# Patient Record
Sex: Female | Born: 1960 | Race: White | Hispanic: No | Smoking: Current every day smoker
Health system: Southern US, Community
[De-identification: ages and names within clinical notes are randomized; demographics above are authoritative.]

## PROBLEM LIST (undated history)

## (undated) ENCOUNTER — Emergency Department: Admission: EM | Discharge status: Absent without leave

## (undated) DIAGNOSIS — E559 Vitamin D deficiency, unspecified: Secondary | ICD-10-CM

## (undated) DIAGNOSIS — M2550 Pain in unspecified joint: Secondary | ICD-10-CM

## (undated) DIAGNOSIS — M159 Polyosteoarthritis, unspecified: Secondary | ICD-10-CM

## (undated) DIAGNOSIS — F329 Major depressive disorder, single episode, unspecified: Secondary | ICD-10-CM

## (undated) DIAGNOSIS — S92902A Unspecified fracture of left foot, initial encounter for closed fracture: Secondary | ICD-10-CM

## (undated) DIAGNOSIS — E785 Hyperlipidemia, unspecified: Secondary | ICD-10-CM

## (undated) DIAGNOSIS — IMO0001 Reserved for inherently not codable concepts without codable children: Secondary | ICD-10-CM

## (undated) DIAGNOSIS — Z1329 Encounter for screening for other suspected endocrine disorder: Secondary | ICD-10-CM

## (undated) DIAGNOSIS — Z13228 Encounter for screening for other metabolic disorders: Secondary | ICD-10-CM

## (undated) DIAGNOSIS — Z130 Encounter for screening for diseases of the blood and blood-forming organs and certain disorders involving the immune mechanism: Secondary | ICD-10-CM

## (undated) DIAGNOSIS — M1710 Unilateral primary osteoarthritis, unspecified knee: Secondary | ICD-10-CM

## (undated) DIAGNOSIS — L608 Other nail disorders: Secondary | ICD-10-CM

## (undated) DIAGNOSIS — M179 Osteoarthritis of knee, unspecified: Secondary | ICD-10-CM

## (undated) DIAGNOSIS — F419 Anxiety disorder, unspecified: Secondary | ICD-10-CM

## (undated) DIAGNOSIS — F3289 Other specified depressive episodes: Secondary | ICD-10-CM

## (undated) DIAGNOSIS — S83206A Unspecified tear of unspecified meniscus, current injury, right knee, initial encounter: Secondary | ICD-10-CM

## (undated) DIAGNOSIS — H04129 Dry eye syndrome of unspecified lacrimal gland: Secondary | ICD-10-CM

## (undated) DIAGNOSIS — Z0000 Encounter for general adult medical examination without abnormal findings: Secondary | ICD-10-CM

## (undated) DIAGNOSIS — R682 Dry mouth, unspecified: Secondary | ICD-10-CM

## (undated) DIAGNOSIS — R928 Other abnormal and inconclusive findings on diagnostic imaging of breast: Secondary | ICD-10-CM

## (undated) HISTORY — DX: Osteoarthritis of knee, unspecified: M17.9

## (undated) HISTORY — DX: Reserved for inherently not codable concepts without codable children: IMO0001

## (undated) HISTORY — DX: Pain in unspecified joint: M25.50

## (undated) HISTORY — DX: Major depressive disorder, single episode, unspecified: F32.9

## (undated) HISTORY — DX: Encounter for screening for diseases of the blood and blood-forming organs and certain disorders involving the immune mechanism: Z13.0

## (undated) HISTORY — DX: Encounter for screening for other suspected endocrine disorder: Z13.29

## (undated) HISTORY — DX: Dry eye syndrome of unspecified lacrimal gland: H04.129

## (undated) HISTORY — DX: Anxiety disorder, unspecified: F41.9

## (undated) HISTORY — DX: Vitamin D deficiency, unspecified: E55.9

## (undated) HISTORY — DX: Unilateral primary osteoarthritis, unspecified knee: M17.10

## (undated) HISTORY — DX: Other nail disorders: L60.8

## (undated) HISTORY — DX: Polyosteoarthritis, unspecified: M15.9

## (undated) HISTORY — DX: Encounter for general adult medical examination without abnormal findings: Z00.00

## (undated) HISTORY — DX: Other abnormal and inconclusive findings on diagnostic imaging of breast: R92.8

## (undated) HISTORY — DX: Hyperlipidemia, unspecified: E78.5

## (undated) HISTORY — DX: Dry mouth, unspecified: R68.2

## (undated) HISTORY — DX: Unspecified fracture of left foot, initial encounter for closed fracture: S92.902A

## (undated) HISTORY — DX: Other specified depressive episodes: F32.89

## (undated) HISTORY — DX: Encounter for screening for other metabolic disorders: Z13.228

## (undated) HISTORY — PX: FACIAL RECONSTRUCTION SURGERY: SHX631

## (undated) HISTORY — DX: Unspecified tear of unspecified meniscus, current injury, right knee, initial encounter: S83.206A

---

## 1990-10-25 HISTORY — PX: NOSE SURGERY: SHX723

## 1998-06-04 ENCOUNTER — Emergency Department (HOSPITAL_COMMUNITY): Admission: EM | Admit: 1998-06-04 | Discharge: 1998-06-04 | Payer: Self-pay | Admitting: Emergency Medicine

## 1998-10-25 HISTORY — PX: TOTAL ABDOMINAL HYSTERECTOMY W/ BILATERAL SALPINGOOPHORECTOMY: SHX83

## 2007-09-05 ENCOUNTER — Ambulatory Visit: Payer: Self-pay | Admitting: Family Medicine

## 2007-09-05 DIAGNOSIS — M159 Polyosteoarthritis, unspecified: Secondary | ICD-10-CM | POA: Insufficient documentation

## 2007-09-05 DIAGNOSIS — E785 Hyperlipidemia, unspecified: Secondary | ICD-10-CM | POA: Insufficient documentation

## 2007-09-05 DIAGNOSIS — M797 Fibromyalgia: Secondary | ICD-10-CM | POA: Insufficient documentation

## 2007-09-28 ENCOUNTER — Encounter: Payer: Self-pay | Admitting: Family Medicine

## 2007-09-28 LAB — CONVERTED CEMR LAB
ALT: 25 units/L (ref 0–35)
Alkaline Phosphatase: 69 units/L (ref 39–117)
BUN: 10 mg/dL (ref 6–23)
CO2: 25 meq/L (ref 19–32)
Calcium: 9.5 mg/dL (ref 8.4–10.5)
Chloride: 103 meq/L (ref 96–112)
Creatinine, Ser: 0.67 mg/dL (ref 0.40–1.20)
Total Bilirubin: 0.4 mg/dL (ref 0.3–1.2)
Total CHOL/HDL Ratio: 5.8
Triglycerides: 589 mg/dL — ABNORMAL HIGH (ref <150)

## 2007-09-29 ENCOUNTER — Encounter: Payer: Self-pay | Admitting: Family Medicine

## 2007-10-02 ENCOUNTER — Ambulatory Visit: Payer: Self-pay | Admitting: Family Medicine

## 2007-11-28 ENCOUNTER — Encounter: Payer: Self-pay | Admitting: Family Medicine

## 2007-12-04 ENCOUNTER — Ambulatory Visit: Payer: Self-pay | Admitting: Family Medicine

## 2007-12-04 DIAGNOSIS — H04129 Dry eye syndrome of unspecified lacrimal gland: Secondary | ICD-10-CM | POA: Insufficient documentation

## 2007-12-04 DIAGNOSIS — M2550 Pain in unspecified joint: Secondary | ICD-10-CM | POA: Insufficient documentation

## 2008-01-04 ENCOUNTER — Telehealth (INDEPENDENT_AMBULATORY_CARE_PROVIDER_SITE_OTHER): Payer: Self-pay | Admitting: *Deleted

## 2008-01-09 ENCOUNTER — Ambulatory Visit: Payer: Self-pay | Admitting: Family Medicine

## 2008-01-11 ENCOUNTER — Encounter: Payer: Self-pay | Admitting: Family Medicine

## 2008-01-11 LAB — CONVERTED CEMR LAB
ALT: 27 U/L
AST: 20 U/L
Anti Nuclear Antibody(ANA): NEGATIVE
Cholesterol: 299 mg/dL — ABNORMAL HIGH
HDL: 56 mg/dL
LDL Cholesterol: 171 mg/dL — ABNORMAL HIGH
Rheumatoid fact SerPl-aCnc: <20 [IU]/mL
Sed Rate: 8 mm/h
Total CHOL/HDL Ratio: 5.3
Triglycerides: 358 mg/dL — ABNORMAL HIGH
VLDL: 72 mg/dL — ABNORMAL HIGH

## 2008-01-12 ENCOUNTER — Telehealth: Payer: Self-pay | Admitting: Family Medicine

## 2008-02-08 ENCOUNTER — Encounter: Payer: Self-pay | Admitting: Family Medicine

## 2008-02-09 ENCOUNTER — Telehealth: Payer: Self-pay | Admitting: Family Medicine

## 2008-02-26 ENCOUNTER — Encounter: Payer: Self-pay | Admitting: Family Medicine

## 2008-08-06 ENCOUNTER — Telehealth: Payer: Self-pay | Admitting: Family Medicine

## 2008-08-08 ENCOUNTER — Telehealth: Payer: Self-pay | Admitting: Family Medicine

## 2008-12-11 ENCOUNTER — Ambulatory Visit: Payer: Self-pay | Admitting: Family Medicine

## 2008-12-11 DIAGNOSIS — F329 Major depressive disorder, single episode, unspecified: Secondary | ICD-10-CM

## 2008-12-11 DIAGNOSIS — F32A Depression, unspecified: Secondary | ICD-10-CM | POA: Insufficient documentation

## 2008-12-12 ENCOUNTER — Encounter: Payer: Self-pay | Admitting: Family Medicine

## 2008-12-12 LAB — CONVERTED CEMR LAB
ALT: 33 units/L (ref 0–35)
AST: 31 units/L (ref 0–37)
Alkaline Phosphatase: 68 units/L (ref 39–117)
BUN: 7 mg/dL (ref 6–23)
Glucose, Bld: 91 mg/dL (ref 70–99)
Potassium: 4.3 meq/L (ref 3.5–5.3)
Sodium: 143 meq/L (ref 135–145)
Triglycerides: 405 mg/dL — ABNORMAL HIGH (ref <150)

## 2008-12-13 ENCOUNTER — Telehealth: Payer: Self-pay | Admitting: Family Medicine

## 2008-12-13 ENCOUNTER — Encounter: Payer: Self-pay | Admitting: Family Medicine

## 2008-12-17 LAB — CONVERTED CEMR LAB: Direct LDL: 154 mg/dL — ABNORMAL HIGH

## 2009-04-07 ENCOUNTER — Telehealth: Payer: Self-pay | Admitting: Family Medicine

## 2009-05-08 ENCOUNTER — Telehealth: Payer: Self-pay | Admitting: Family Medicine

## 2009-07-14 ENCOUNTER — Encounter: Admission: RE | Admit: 2009-07-14 | Discharge: 2009-07-14 | Payer: Self-pay | Admitting: Family Medicine

## 2009-07-16 DIAGNOSIS — R928 Other abnormal and inconclusive findings on diagnostic imaging of breast: Secondary | ICD-10-CM | POA: Insufficient documentation

## 2009-08-13 ENCOUNTER — Encounter: Admission: RE | Admit: 2009-08-13 | Discharge: 2009-08-13 | Payer: Self-pay | Admitting: Family Medicine

## 2009-09-09 ENCOUNTER — Telehealth: Payer: Self-pay | Admitting: Family Medicine

## 2009-10-08 ENCOUNTER — Telehealth: Payer: Self-pay | Admitting: Family Medicine

## 2009-10-10 ENCOUNTER — Telehealth: Payer: Self-pay | Admitting: Family Medicine

## 2009-11-07 ENCOUNTER — Telehealth: Payer: Self-pay | Admitting: Family Medicine

## 2009-11-10 ENCOUNTER — Ambulatory Visit: Payer: Self-pay | Admitting: Family Medicine

## 2009-12-18 ENCOUNTER — Encounter: Payer: Self-pay | Admitting: Family Medicine

## 2009-12-19 LAB — CONVERTED CEMR LAB
ALT: 19 units/L (ref 0–35)
AST: 19 units/L (ref 0–37)
Alkaline Phosphatase: 59 units/L (ref 39–117)
Cholesterol: 219 mg/dL — ABNORMAL HIGH (ref 0–200)
Creatinine, Ser: 0.68 mg/dL (ref 0.40–1.20)
Glucose, Bld: 87 mg/dL (ref 70–99)
LDL Cholesterol: 84 mg/dL (ref 0–99)
Potassium: 4.1 meq/L (ref 3.5–5.3)
Total Bilirubin: 0.4 mg/dL (ref 0.3–1.2)
Total Protein: 7 g/dL (ref 6.0–8.3)
VLDL: 78 mg/dL — ABNORMAL HIGH (ref 0–40)

## 2010-04-13 ENCOUNTER — Ambulatory Visit: Payer: Self-pay | Admitting: Internal Medicine

## 2010-04-19 ENCOUNTER — Ambulatory Visit: Payer: Self-pay | Admitting: Internal Medicine

## 2010-05-05 ENCOUNTER — Telehealth: Payer: Self-pay | Admitting: Family Medicine

## 2010-05-05 DIAGNOSIS — E559 Vitamin D deficiency, unspecified: Secondary | ICD-10-CM | POA: Insufficient documentation

## 2010-05-25 ENCOUNTER — Ambulatory Visit: Payer: Self-pay | Admitting: Family Medicine

## 2010-05-25 DIAGNOSIS — L608 Other nail disorders: Secondary | ICD-10-CM | POA: Insufficient documentation

## 2010-05-26 LAB — CONVERTED CEMR LAB: Vit D, 25-Hydroxy: 63 ng/mL (ref 30–89)

## 2010-06-04 ENCOUNTER — Telehealth: Payer: Self-pay | Admitting: Family Medicine

## 2010-07-31 ENCOUNTER — Telehealth: Payer: Self-pay | Admitting: Family Medicine

## 2010-08-31 ENCOUNTER — Telehealth: Payer: Self-pay | Admitting: Family Medicine

## 2010-10-29 ENCOUNTER — Telehealth: Payer: Self-pay | Admitting: Family Medicine

## 2010-11-15 ENCOUNTER — Encounter: Payer: Self-pay | Admitting: Family Medicine

## 2010-11-24 NOTE — Progress Notes (Signed)
Summary: Refills  Phone Note Refill Request   Refills Requested: Medication #1:  VICOPROFEN 7.5-200 MG  TABS 1 tab by mouth q 8 hrs as needed severe pain  Medication #2:  KLONOPIN 0.5 MG  TABS 1 tab by mouth bid Initial call taken by: Payton Spark CMA,  June 04, 2010 1:48 PM    Prescriptions: KLONOPIN 0.5 MG  TABS (CLONAZEPAM) 1 tab by mouth bid  #60 x 0   Entered and Authorized by:   Seymour Bars DO   Signed by:   Seymour Bars DO on 06/05/2010   Method used:   Printed then faxed to ...       [facility] [phone][facility] (retail)       [address] [place], [place]  [address]       Ph: [phone] or [phone]       Fax: [phone]   RxID:   [id number] VICOPROFEN 7.5-200 MG  TABS (HYDROCODONE-IBUPROFEN) 1 tab by mouth q 8 hrs as needed severe pain  #60 x 0   Entered and Authorized by:   Seymour Bars DO   Signed by:   Seymour Bars DO on 06/05/2010   Method used:   Printed then faxed to ...       [facility]  [place] [phone][facility] (retail)       [address] [place], [place]  [address]       Ph: [phone] or [phone]       Fax: [phone]   RxID:   [id number]

## 2010-11-24 NOTE — Progress Notes (Signed)
Summary: Refills  Phone Note Refill Request   Refills Requested: Medication #1:  VICOPROFEN 7.5-200 MG  TABS 1 tab by mouth q 8 hrs as needed severe pain  Medication #2:  KLONOPIN 0.5 MG  TABS 1 tab by mouth bid Initial call taken by: Payton Spark CMA,  July 31, 2010 11:05 AM    Prescriptions: KLONOPIN 0.5 MG  TABS (CLONAZEPAM) 1 tab by mouth bid  #60 x 0   Entered and Authorized by:   Seymour Bars DO   Signed by:   Seymour Bars DO on 07/31/2010   Method used:   Printed then faxed to ...       [facility] [phone]* (retail)       [address] [place], [place]  [address]       Ph: [phone] or [phone]       Fax: [phone]   RxID:   [id number] VICOPROFEN 7.5-200 MG  TABS (HYDROCODONE-IBUPROFEN) 1 tab by mouth q 8 hrs as needed severe pain  #60 x 0   Entered and Authorized by:   Seymour Bars DO   Signed by:   Seymour Bars DO on 07/31/2010   Method used:   Printed then faxed to ...       [facility]  [place] [phone][facility] (retail)       [address] [place], [place]  [address]       Ph: [phone] or [phone]       Fax: [phone]   RxID:   [id number]
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
## 2010-11-24 NOTE — Progress Notes (Signed)
  Phone Note Refill Request   Refills Requested: Medication #1:  VICOPROFEN 7.5-200 MG  TABS 1 tab by mouth q 8 hrs as needed severe pain  Medication #2:  KLONOPIN 0.5 MG  TABS 1 tab by mouth bid Initial call taken by: Payton Spark CMA,  May 05, 2010 1:21 PM  Follow-up for Phone Call        due for OV in Aug. Will RF for July. Follow-up by: Seymour Bars DO,  May 05, 2010 1:27 PM    Prescriptions: KLONOPIN 0.5 MG  TABS (CLONAZEPAM) 1 tab by mouth bid  #60 x 0   Entered and Authorized by:   Seymour Bars DO   Signed by:   Seymour Bars DO on 05/05/2010   Method used:   Printed then faxed to ...       [facility]  [place] [phone][facility] ([facility])       [address] [place], [place]  [address]       Ph: [phone] or [phone]       Fax: [phone]   RxID:   [id number] VICOPROFEN 7.5-200 MG  TABS (HYDROCODONE-IBUPROFEN) 1 tab by mouth q 8 hrs as needed severe pain  #60 x 0   Entered and Authorized by:   Seymour Bars DO   Signed by:   Seymour Bars DO on 05/05/2010   Method used:   Printed then faxed to ...       [facility]  [place] [phone][facility] (retail)       [address] [place], [place]  [address]       Ph: [phone] or [phone]       Fax: [phone]   RxID:   [id number]

## 2010-11-24 NOTE — Progress Notes (Signed)
Summary: refill request  Phone Note Refill Request Message from:  Patient on August 31, 2010 9:04 AM  Refills Requested: Medication #1:  VICOPROFEN 7.5-200 MG  TABS 1 tab by mouth q 8 hrs as needed severe pain   Supply Requested: 1 month  Medication #2:  KLONOPIN 0.5 MG  TABS 1 tab by mouth bid   Supply Requested: 1 month [facility] [address]   Method Requested: Fax to Local Pharmacy Initial call taken by: Kathlene November LPN,  August 31, 2010 9:04 AM    Prescriptions: KLONOPIN 0.5 MG  TABS (CLONAZEPAM) 1 tab by mouth bid  #60 x 0   Entered and Authorized by:   Seymour Bars DO   Signed by:   Seymour Bars DO on 08/31/2010   Method used:   Printed then faxed to ...       [facility]  [place] [phone][facility] (retail)       [address] [place], [place]  [address]       Ph: [phone] or [phone]       Fax: [phone]   RxID:   [id number] VICOPROFEN 7.5-200 MG  TABS (HYDROCODONE-IBUPROFEN) 1 tab by mouth q 8 hrs as needed severe pain  #60 x 0   Entered and Authorized by:   Seymour Bars DO   Signed by:   Seymour Bars DO on 08/31/2010   Method used:   Printed then faxed to ...       [facility]  [place] [phone]* (retail)       [address] [place], [place]  [address]       Ph: [phone] or [phone]       Fax: [phone]   RxID:   [id number]
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
## 2010-11-24 NOTE — Progress Notes (Signed)
Summary: Klonopin and Vicoprofen refills  Phone Note Refill Request   Refills Requested: Medication #1:  KLONOPIN 0.5 MG  TABS 1 tab by mouth bid  Medication #2:  VICOPROFEN 7.5-200 MG  TABS 1 tab by mouth q 8 hrs as needed severe pain Initial call taken by: Payton Spark CMA,  November 07, 2009 3:48 PM    Denied.  Pt overdue for OV.  Seymour Bars, D.O.  Appended Document: Klonopin and Vicoprofen refills LMOM informing Pt of the above.

## 2010-11-26 NOTE — Progress Notes (Signed)
Summary: ? OV  Phone Note Call from Patient   Caller: Patient Summary of Call: Pt LMOM stating she thinks she is due for OV in Feb and wanted to know if she needed CPE or F/U. She also wanted to know if she needed fasting labs w/ this visit. Please advise. Initial call taken by: Payton Spark CMA,  October 29, 2010 11:55 AM  Follow-up for Phone Call        schedule CPE with fasting labs after 2-24.   Follow-up by: Seymour Bars DO,  October 29, 2010 1:42 PM     Appended Document: ? OV Pt aware of the above

## 2010-12-18 ENCOUNTER — Telehealth: Payer: Self-pay | Admitting: Family Medicine

## 2010-12-22 ENCOUNTER — Encounter: Payer: Self-pay | Admitting: Family Medicine

## 2010-12-22 ENCOUNTER — Ambulatory Visit (INDEPENDENT_AMBULATORY_CARE_PROVIDER_SITE_OTHER): Payer: BC Managed Care – PPO | Admitting: Family Medicine

## 2010-12-22 DIAGNOSIS — H04129 Dry eye syndrome of unspecified lacrimal gland: Secondary | ICD-10-CM

## 2010-12-22 DIAGNOSIS — F329 Major depressive disorder, single episode, unspecified: Secondary | ICD-10-CM

## 2010-12-22 DIAGNOSIS — F3289 Other specified depressive episodes: Secondary | ICD-10-CM

## 2010-12-22 DIAGNOSIS — IMO0001 Reserved for inherently not codable concepts without codable children: Secondary | ICD-10-CM

## 2010-12-22 NOTE — Progress Notes (Signed)
Summary: Appt 2/28--?labs prior  Phone Note Call from Patient Call back at Home Phone [phone]   Summary of Call: Patient left message on vm yesterday (2:59pm) that she has an upcoming appt on 12/22/10. She would like to know if labs are needed prior. Please advise. Initial call taken by: Lucious Groves CMA,  December 18, 2010 9:09 AM  Follow-up for Phone Call        order printed. Follow-up by: Seymour Bars DO,  December 18, 2010 9:19 AM  New Problems: OTH&UNSPEC ENDOCRN NUTRIT METAB&IMMUNITY D/O (ICD-V77.99)   New Problems: OTH&UNSPEC ENDOCRN NUTRIT METAB&IMMUNITY D/O (ICD-V77.99)  Appended Document: Appt 2/28--?labs prior    Phone Note Outgoing Call   Summary of Call: MD printed order for the pt. Left message on machine to call back to office. Lucious Groves CMA  December 18, 2010 9:33 AM   Patient notified and aware to be fasting. Lucious Groves CMA  December 18, 2010 10:50 AM

## 2010-12-23 LAB — CONVERTED CEMR LAB
ALT: 23 units/L (ref 0–35)
Alkaline Phosphatase: 71 units/L (ref 39–117)
BUN: 8 mg/dL (ref 6–23)
Calcium: 9.3 mg/dL (ref 8.4–10.5)
Creatinine, Ser: 0.72 mg/dL (ref 0.40–1.20)
Potassium: 3.9 meq/L (ref 3.5–5.3)
Total Bilirubin: 0.4 mg/dL (ref 0.3–1.2)
Total Protein: 6.8 g/dL (ref 6.0–8.3)
Triglycerides: 350 mg/dL — ABNORMAL HIGH (ref <150)
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
## 2011-01-12 NOTE — Letter (Signed)
Summary: Patient Health Questionnaire  Patient Health Questionnaire   Imported By: Maryln Gottron 01/05/2011 11:27:47  _____________________________________________________________________  External Attachment:    Type:   Image     Comment:   External Document

## 2011-01-26 ENCOUNTER — Other Ambulatory Visit: Payer: Self-pay | Admitting: *Deleted

## 2011-01-26 MED ORDER — HYDROCODONE-IBUPROFEN 7.5-200 MG PO TABS: 1.0000 | ORAL_TABLET | Freq: Three times a day (TID) | ORAL | Status: DC | PRN

## 2011-01-26 MED ORDER — CLONAZEPAM 0.5 MG PO TABS: 0.5000 mg | ORAL_TABLET | Freq: Two times a day (BID) | ORAL | Status: DC | PRN

## 2011-02-05 ENCOUNTER — Emergency Department: Payer: Self-pay | Admitting: Emergency Medicine

## 2011-02-06 ENCOUNTER — Encounter: Payer: Self-pay | Admitting: Family Medicine

## 2011-02-06 ENCOUNTER — Ambulatory Visit (INDEPENDENT_AMBULATORY_CARE_PROVIDER_SITE_OTHER): Payer: BC Managed Care – PPO | Admitting: Family Medicine

## 2011-02-06 VITALS — BP 156/100 | HR 92 | Temp 99.0°F | Ht 67.0 in | Wt 160.0 lb

## 2011-02-06 DIAGNOSIS — K047 Periapical abscess without sinus: Secondary | ICD-10-CM
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
## 2011-02-06 NOTE — Progress Notes (Signed)
  Subjective:    Patient ID: Lori [name], female    DOB: Jul 13, 1961, 50 y.o.   MRN: [id number]  HPI CC: mouth pain  3d h/o swelling in mouth, R side.  Called dentist on Friday and this am, no response.  Went to [facility] ER (yesterday), given amoxicillin 500mg  tid as well as oxycodone 5mg  #10.  Has doubled up on amoxicillin, took 2500mg  yesterday and 1000mg  so far today.  Finished with oxycodone overnight.  Yesterday pain 9/10, today 8/10.  Tmax 99.  bp elevated today.  Pt endorses hoarseness and trouble swallowing.  No trouble opening/closing mouth but is tender to talk.  Under pain contract with Dr. Cathey Endow, takes hydrocodone bid and ibuprofen in between.  Has been on narcotic therapy for 3 years, states pain meds don't phase her.  H/o fibromyalgia, arthritis per patient.  ? Sjogrens.  At ER told needed to have dentist drain abscess.  Acetaminophen making bloated and weight gain so stopped this.  Review of Systems Per HPI    Objective:   Physical Exam  Constitutional: She appears well-developed and well-nourished. She appears distressed (uncomfortable from mouth lesion).  HENT:  Head: Normocephalic and atraumatic. No trismus in the jaw.  Right Ear: External ear normal.  Left Ear: External ear normal.  Nose: Nose normal.  Mouth/Throat: Oropharynx is clear and moist and mucous membranes are normal. Oral lesions present. Abnormal dentition. Dental abscesses present. No uvula swelling.         + tender to palpation R maxilla and slight swelling maxillary region.  Eyes: Conjunctivae and EOM are normal. Pupils are equal, round, and reactive to light.  Neck: Normal range of motion. Neck supple. No thyromegaly present.  Cardiovascular: Normal rate, regular rhythm, normal heart sounds and intact distal pulses.   No murmur heard. Lymphadenopathy:       Head (right side): No submental, no submandibular, no tonsillar, no preauricular, no posterior auricular and no occipital adenopathy present.      Head (left side): No submental, no submandibular, no tonsillar, no preauricular, no posterior auricular and no occipital adenopathy present.    She has no cervical adenopathy.          Assessment & Plan:

## 2011-02-06 NOTE — Assessment & Plan Note (Addendum)
R upper hard palate/premolar abscess.  No systemic sxs currently. On abx x 2 days, took 20 advil 200mg  throughout course of day yesterday, advised against doing this, advised to push water.  Stop all NSAIDs for now. Finished 10 oxycodone 5mg  given at ER overnight.  Advised to start tylenol and I refilled oxycodone 10mg  x #10 to treat acute abscess as I will recommend stop NSAIDs for now (overdosed yesterday). I called Dr. Lucky Cowboy, on call dentist, to discuss pt - he agrees to see her in office this afternoon to eval, possible I&D of abscess.  I think it will need drainage. Continue amoxicillin 500mg  tid for now. Rec f/u with PCP this week for pain management.

## 2011-02-09 ENCOUNTER — Telehealth: Payer: Self-pay | Admitting: Family Medicine

## 2011-02-09 NOTE — Telephone Encounter (Signed)
Pls let pt know that I received her call a nurse note and wanted to see how she is feeling.  It was not clear if she is having sinus symptoms or a dental problem from the note.

## 2011-02-10 NOTE — Telephone Encounter (Signed)
LMOM for Pt to CB 

## 2011-02-25 ENCOUNTER — Other Ambulatory Visit: Payer: Self-pay | Admitting: *Deleted

## 2011-02-25 MED ORDER — HYDROCODONE-IBUPROFEN 7.5-200 MG PO TABS: 1.0000 | ORAL_TABLET | Freq: Three times a day (TID) | ORAL | Status: DC | PRN

## 2011-02-25 MED ORDER — CLONAZEPAM 0.5 MG PO TABS: 0.5000 mg | ORAL_TABLET | Freq: Two times a day (BID) | ORAL | Status: DC | PRN

## 2011-02-26 ENCOUNTER — Telehealth: Payer: Self-pay | Admitting: Family Medicine

## 2011-02-26 MED ORDER — CLONAZEPAM 0.5 MG PO TABS: 0.5000 mg | ORAL_TABLET | Freq: Two times a day (BID) | ORAL | Status: DC | PRN

## 2011-02-26 MED ORDER — HYDROCODONE-IBUPROFEN 7.5-200 MG PO TABS: 1.0000 | ORAL_TABLET | Freq: Three times a day (TID) | ORAL | Status: DC | PRN

## 2011-02-26 NOTE — Telephone Encounter (Signed)
RX's RFd today.

## 2011-02-26 NOTE — Telephone Encounter (Signed)
Pt called to let Payton Spark know that the pharmacy found her script.  Just FYI Jarvis Newcomer, LPN Domingo Dimes

## 2011-03-26 ENCOUNTER — Other Ambulatory Visit: Payer: Self-pay | Admitting: *Deleted

## 2011-03-26 MED ORDER — HYDROCODONE-IBUPROFEN 7.5-200 MG PO TABS: 1.0000 | ORAL_TABLET | Freq: Three times a day (TID) | ORAL | Status: DC | PRN

## 2011-03-26 MED ORDER — CLONAZEPAM 0.5 MG PO TABS: 0.5000 mg | ORAL_TABLET | Freq: Two times a day (BID) | ORAL | Status: DC | PRN

## 2011-04-23 ENCOUNTER — Telehealth: Payer: Self-pay | Admitting: Family Medicine

## 2011-04-23 MED ORDER — CLONAZEPAM 0.5 MG PO TABS: 0.5000 mg | ORAL_TABLET | Freq: Two times a day (BID) | ORAL | Status: DC | PRN

## 2011-04-23 MED ORDER — HYDROCODONE-IBUPROFEN 7.5-200 MG PO TABS: 1.0000 | ORAL_TABLET | Freq: Three times a day (TID) | ORAL | Status: DC | PRN

## 2011-05-25 NOTE — Telephone Encounter (Signed)
Error

## 2011-05-26 ENCOUNTER — Other Ambulatory Visit: Payer: Self-pay | Admitting: Family Medicine

## 2011-05-26 MED ORDER — HYDROCODONE-IBUPROFEN 7.5-200 MG PO TABS: 1.0000 | ORAL_TABLET | Freq: Three times a day (TID) | ORAL | Status: DC | PRN

## 2011-05-26 MED ORDER — CLONAZEPAM 0.5 MG PO TABS: 0.5000 mg | ORAL_TABLET | Freq: Two times a day (BID) | ORAL | Status: DC | PRN

## 2011-05-26 NOTE — Telephone Encounter (Signed)
Pt calling for refills of her clonazepam and hydrocodone medications.  Not asking for meds too early and on pain contract with Dr. Cathey Endow. Plan:  Called a verbal on both these medications since the vicoprofen for whatever reason would not print off.  However, they are showing in the system. Jarvis Newcomer, LPN Domingo Dimes

## 2011-05-27 ENCOUNTER — Telehealth: Payer: Self-pay | Admitting: Family Medicine

## 2011-06-21 NOTE — Telephone Encounter (Signed)
Closed

## 2011-06-22 ENCOUNTER — Ambulatory Visit (INDEPENDENT_AMBULATORY_CARE_PROVIDER_SITE_OTHER): Payer: BC Managed Care – PPO | Admitting: Family Medicine

## 2011-06-22 ENCOUNTER — Encounter: Payer: Self-pay | Admitting: Family Medicine

## 2011-06-22 VITALS — BP 120/78 | HR 95 | Ht 66.0 in | Wt 153.0 lb

## 2011-06-22 DIAGNOSIS — F329 Major depressive disorder, single episode, unspecified: Secondary | ICD-10-CM

## 2011-06-22 DIAGNOSIS — M159 Polyosteoarthritis, unspecified: Secondary | ICD-10-CM

## 2011-06-22 DIAGNOSIS — F3289 Other specified depressive episodes: Secondary | ICD-10-CM

## 2011-06-22 MED ORDER — PITAVASTATIN CALCIUM 2 MG PO TABS: 2.0000 mg | ORAL_TABLET | Freq: Every day | ORAL | Status: DC

## 2011-06-22 NOTE — Assessment & Plan Note (Addendum)
PHQ-9 score of 1 today. She is doing well. Continue current regimen. Followup in 6 months.

## 2011-06-22 NOTE — Assessment & Plan Note (Signed)
Continue current regimen. Followup in 6 months. I also discussed with her the importance of using her medication only as needed for pain control. We reviewed that she can develop a tolerance to the medication and to avoid this if she should not use the medication on a daily basis.

## 2011-06-22 NOTE — Progress Notes (Signed)
  Subjective:    Patient ID: Lori [name], female    DOB: 1961/06/10, 50 y.o.   MRN: [id number]  HPI  Depression. On zoloft and klonopin for several years.  She is sleeping well. She doesn't want to wean off her meds. She is happy with her mood right now. She also has OCD as feels it is well controlled.   Hx of OA, knee pain, and fibromyalgia. Was placed on hydrocodone twice day. She said she was taken off of tramadol because of the potential interaction with her Zoloft. She also says that she felt most high on the tramadol. She feels this helps her stay moving and active. Says occ skips her med when she is feeling better.   Review of Systems     Objective:   Physical Exam  Constitutional: She is oriented to person, place, and time. She appears well-developed and well-nourished.  HENT:  Head: Normocephalic and atraumatic.  Cardiovascular: Normal rate, regular rhythm and normal heart sounds.   Pulmonary/Chest: Effort normal and breath sounds normal.  Neurological: She is alert and oriented to person, place, and time. She displays normal reflexes.  Skin: Skin is warm and dry.  Psychiatric: She has a normal mood and affect. Her behavior is normal.          Assessment & Plan:

## 2011-06-24 ENCOUNTER — Other Ambulatory Visit: Payer: Self-pay | Admitting: *Deleted

## 2011-06-24 MED ORDER — CLONAZEPAM 0.5 MG PO TABS: 0.5000 mg | ORAL_TABLET | Freq: Two times a day (BID) | ORAL | Status: DC | PRN

## 2011-06-24 MED ORDER — HYDROCODONE-IBUPROFEN 7.5-200 MG PO TABS: 1.0000 | ORAL_TABLET | Freq: Three times a day (TID) | ORAL | Status: DC | PRN

## 2011-07-09 ENCOUNTER — Other Ambulatory Visit: Payer: Self-pay | Admitting: Family Medicine

## 2011-07-09 NOTE — Telephone Encounter (Signed)
Closed

## 2011-07-12 ENCOUNTER — Telehealth: Payer: Self-pay | Admitting: Family Medicine

## 2011-07-12 NOTE — Telephone Encounter (Signed)
Received fax from [facility] needing prior auth on pt med for livalo 2 mg tablets. Plan:  [employer] and spoke with Felicia  For prior auth of livalo 2 mg.  Will send form for completion, and will need to fax back.  Filled out as much as could, and will give to Fr. Metheney to review and sign, and then will fax back to [facility]. Jarvis Newcomer, LPN Domingo Dimes

## 2011-07-22 ENCOUNTER — Other Ambulatory Visit: Payer: Self-pay | Admitting: Family Medicine

## 2011-07-22 MED ORDER — HYDROCODONE-IBUPROFEN 7.5-200 MG PO TABS: 1.0000 | ORAL_TABLET | Freq: Three times a day (TID) | ORAL | Status: DC | PRN

## 2011-07-22 MED ORDER — CLONAZEPAM 0.5 MG PO TABS: 0.5000 mg | ORAL_TABLET | Freq: Two times a day (BID) | ORAL | Status: DC | PRN

## 2011-08-19 ENCOUNTER — Other Ambulatory Visit: Payer: Self-pay | Admitting: *Deleted

## 2011-08-19 MED ORDER — HYDROCODONE-IBUPROFEN 7.5-200 MG PO TABS: 1.0000 | ORAL_TABLET | Freq: Three times a day (TID) | ORAL | Status: DC | PRN

## 2011-08-19 MED ORDER — CLONAZEPAM 0.5 MG PO TABS: 0.5000 mg | ORAL_TABLET | Freq: Two times a day (BID) | ORAL | Status: DC | PRN

## 2011-09-13 ENCOUNTER — Other Ambulatory Visit: Payer: Self-pay | Admitting: *Deleted

## 2011-09-13 ENCOUNTER — Other Ambulatory Visit: Payer: Self-pay | Admitting: Family Medicine

## 2011-09-13 MED ORDER — CLONAZEPAM 0.5 MG PO TABS: 0.5000 mg | ORAL_TABLET | Freq: Two times a day (BID) | ORAL | Status: DC | PRN

## 2011-09-13 MED ORDER — HYDROCODONE-IBUPROFEN 7.5-200 MG PO TABS: 1.0000 | ORAL_TABLET | Freq: Three times a day (TID) | ORAL | Status: DC | PRN

## 2011-10-13 ENCOUNTER — Other Ambulatory Visit: Payer: Self-pay | Admitting: *Deleted

## 2011-10-13 MED ORDER — CLONAZEPAM 0.5 MG PO TABS: 0.5000 mg | ORAL_TABLET | Freq: Two times a day (BID) | ORAL | Status: DC | PRN

## 2011-10-13 MED ORDER — HYDROCODONE-IBUPROFEN 7.5-200 MG PO TABS: 1.0000 | ORAL_TABLET | Freq: Three times a day (TID) | ORAL | Status: DC | PRN

## 2011-11-11 ENCOUNTER — Other Ambulatory Visit: Payer: Self-pay | Admitting: *Deleted

## 2011-11-11 ENCOUNTER — Other Ambulatory Visit: Payer: Self-pay | Admitting: Family Medicine

## 2011-11-11 MED ORDER — CLONAZEPAM 0.5 MG PO TABS: 0.5000 mg | ORAL_TABLET | Freq: Two times a day (BID) | ORAL | Status: DC | PRN

## 2011-11-11 MED ORDER — HYDROCODONE-IBUPROFEN 7.5-200 MG PO TABS: 1.0000 | ORAL_TABLET | Freq: Three times a day (TID) | ORAL | Status: DC | PRN

## 2011-12-10 ENCOUNTER — Other Ambulatory Visit: Payer: Self-pay | Admitting: Family Medicine

## 2011-12-10 ENCOUNTER — Other Ambulatory Visit: Payer: Self-pay | Admitting: *Deleted

## 2011-12-10 MED ORDER — HYDROCODONE-IBUPROFEN 7.5-200 MG PO TABS: 1.0000 | ORAL_TABLET | Freq: Three times a day (TID) | ORAL | Status: DC | PRN

## 2011-12-10 MED ORDER — CLONAZEPAM 0.5 MG PO TABS: 0.5000 mg | ORAL_TABLET | Freq: Two times a day (BID) | ORAL | Status: DC | PRN

## 2011-12-10 MED ORDER — SERTRALINE HCL 100 MG PO TABS: 100.0000 mg | ORAL_TABLET | Freq: Every day | ORAL | Status: DC

## 2011-12-17 ENCOUNTER — Encounter: Payer: Self-pay | Admitting: *Deleted

## 2011-12-22 ENCOUNTER — Telehealth: Payer: Self-pay | Admitting: *Deleted

## 2011-12-22 DIAGNOSIS — E785 Hyperlipidemia, unspecified: Secondary | ICD-10-CM

## 2011-12-22 DIAGNOSIS — Z1329 Encounter for screening for other suspected endocrine disorder: Secondary | ICD-10-CM

## 2011-12-22 DIAGNOSIS — Z130 Encounter for screening for diseases of the blood and blood-forming organs and certain disorders involving the immune mechanism: Secondary | ICD-10-CM

## 2011-12-22 NOTE — Telephone Encounter (Signed)
Pt called for lab order 

## 2011-12-23 ENCOUNTER — Ambulatory Visit (INDEPENDENT_AMBULATORY_CARE_PROVIDER_SITE_OTHER): Payer: BC Managed Care – PPO | Admitting: Family Medicine

## 2011-12-23 ENCOUNTER — Encounter: Payer: Self-pay | Admitting: Family Medicine

## 2011-12-23 DIAGNOSIS — F429 Obsessive-compulsive disorder, unspecified: Secondary | ICD-10-CM

## 2011-12-23 DIAGNOSIS — F329 Major depressive disorder, single episode, unspecified: Secondary | ICD-10-CM

## 2011-12-23 DIAGNOSIS — E785 Hyperlipidemia, unspecified: Secondary | ICD-10-CM

## 2011-12-23 DIAGNOSIS — Z1211 Encounter for screening for malignant neoplasm of colon: Secondary | ICD-10-CM

## 2011-12-23 DIAGNOSIS — G8929 Other chronic pain: Secondary | ICD-10-CM

## 2011-12-23 DIAGNOSIS — F3289 Other specified depressive episodes: Secondary | ICD-10-CM

## 2011-12-23 DIAGNOSIS — F32A Depression, unspecified: Secondary | ICD-10-CM

## 2011-12-23 LAB — LIPID PANEL
Cholesterol: 248 mg/dL — ABNORMAL HIGH (ref 0–200)
HDL: 63 mg/dL (ref >39)
LDL Cholesterol: 126 mg/dL — ABNORMAL HIGH (ref 0–99)
Triglycerides: 293 mg/dL — ABNORMAL HIGH (ref <150)
VLDL: 59 mg/dL — ABNORMAL HIGH (ref 0–40)

## 2011-12-23 LAB — COMPLETE METABOLIC PANEL WITH GFR
ALT: 41 U/L — ABNORMAL HIGH (ref 0–35)
CO2: 26 mEq/L (ref 19–32)
Calcium: 10 mg/dL (ref 8.4–10.5)
Chloride: 103 mEq/L (ref 96–112)
Creat: 0.75 mg/dL (ref 0.50–1.10)
GFR, Est African American: >89 mL/min

## 2011-12-23 NOTE — Progress Notes (Signed)
  Subjective:    Patient ID: Lori [name], female    DOB: May 09, 1961, 51 y.o.   MRN: [id number]  HPI Depression - doing well overall. No decreased mood or major sleep problems.  Hyperlipidemia - she is taking hre livalo about 5 days per week. Also has worked on reducing her diary and fat intake.   Chronic Pain and fibromyalgia - She says she really tries to stay active. Doesn't take the vicoprofen every day.Does take Tyelnol every day.    Review of Systems     Objective:   Physical Exam  Constitutional: She is oriented to person, place, and time. She appears well-developed and well-nourished.  HENT:  Head: Normocephalic and atraumatic.  Cardiovascular: Normal rate, regular rhythm and normal heart sounds.   Pulmonary/Chest: Effort normal and breath sounds normal.  Neurological: She is alert and oriented to person, place, and time.  Skin: Skin is warm and dry.  Psychiatric: She has a normal mood and affect. Her behavior is normal.          Assessment & Plan:  Depresssion - PHQ- 9 score of 0. Continue sertraline. Also for her OCD and her fibromyalgia.   Hyperlipidemia  - Awaiting lipids. She went for blood work this morning. Will adjust if needed. Only taking 5 nights per week  Chornic Pain/Fibromyalgia - Has a narcotic contract. Dong well. I feel she is using her medications appropriately at this point in time. Followup in 6 months. She is doing on the sertraline to continue this.  Discussed need for colonoscopy. Pt ok with referrra.

## 2011-12-24 ENCOUNTER — Telehealth: Payer: Self-pay | Admitting: *Deleted

## 2011-12-24 NOTE — Telephone Encounter (Signed)
Pt notiified of MD instructions. KJ LPN

## 2011-12-24 NOTE — Telephone Encounter (Signed)
Pt calls back and states she takes 3000mg  of Tylenol daily as well as Ibuprofen 600mg  and Vicoprofen for her pain. States that if she can not take the Tylenol then she will not be able to function. Wants to know what she can do

## 2011-12-24 NOTE — Telephone Encounter (Signed)
Let just decrease the tylenol to 2000mg  a day for 1 week and then recheck the enzymes.

## 2012-01-05 ENCOUNTER — Other Ambulatory Visit: Payer: Self-pay | Admitting: *Deleted

## 2012-01-05 ENCOUNTER — Telehealth: Payer: Self-pay | Admitting: *Deleted

## 2012-01-05 DIAGNOSIS — R748 Abnormal levels of other serum enzymes: Secondary | ICD-10-CM

## 2012-01-05 MED ORDER — CLONAZEPAM 0.5 MG PO TABS: 0.5000 mg | ORAL_TABLET | Freq: Two times a day (BID) | ORAL | Status: DC | PRN

## 2012-01-05 MED ORDER — HYDROCODONE-IBUPROFEN 7.5-200 MG PO TABS: 1.0000 | ORAL_TABLET | Freq: Three times a day (TID) | ORAL | Status: DC | PRN

## 2012-01-05 NOTE — Telephone Encounter (Signed)
Pt calling for lab oder to be sent to the lab

## 2012-01-06 ENCOUNTER — Other Ambulatory Visit: Payer: Self-pay | Admitting: Family Medicine

## 2012-01-06 DIAGNOSIS — R748 Abnormal levels of other serum enzymes: Secondary | ICD-10-CM

## 2012-01-06 LAB — COMPLETE METABOLIC PANEL WITH GFR
AST: 43 U/L — ABNORMAL HIGH (ref 0–37)
Albumin: 4.6 g/dL (ref 3.5–5.2)
BUN: 7 mg/dL (ref 6–23)
Calcium: 10.1 mg/dL (ref 8.4–10.5)
Chloride: 104 mEq/L (ref 96–112)
Creat: 0.69 mg/dL (ref 0.50–1.10)
GFR, Est Non African American: >89 mL/min
Glucose, Bld: 75 mg/dL (ref 70–99)
Potassium: 4.4 mEq/L (ref 3.5–5.3)

## 2012-01-10 ENCOUNTER — Ambulatory Visit
Admission: RE | Admit: 2012-01-10 | Discharge: 2012-01-10 | Disposition: A | Discharge status: Home / Self Care | Payer: BC Managed Care – PPO | Source: Ambulatory Visit | Attending: Family Medicine | Admitting: Family Medicine

## 2012-01-10 DIAGNOSIS — R748 Abnormal levels of other serum enzymes: Secondary | ICD-10-CM

## 2012-01-14 ENCOUNTER — Other Ambulatory Visit: Payer: Self-pay | Admitting: Family Medicine

## 2012-01-14 ENCOUNTER — Telehealth: Payer: Self-pay | Admitting: *Deleted

## 2012-01-14 DIAGNOSIS — R0602 Shortness of breath: Secondary | ICD-10-CM

## 2012-01-14 DIAGNOSIS — N281 Cyst of kidney, acquired: Secondary | ICD-10-CM

## 2012-01-14 NOTE — Telephone Encounter (Signed)
No but we can get CXR if would like. 2 view.

## 2012-01-14 NOTE — Telephone Encounter (Signed)
Pt would like to have the chest xray done. Please enter order

## 2012-01-14 NOTE — Telephone Encounter (Signed)
Pt calls back and ask since getting MRI of kidneys she wanted to know if she could get one of her lungs because of SOB and hard to breathe mostly on the right side. Explained I would ask but not sure if this could be done.

## 2012-01-28 ENCOUNTER — Telehealth: Payer: Self-pay | Admitting: *Deleted

## 2012-01-28 DIAGNOSIS — N281 Cyst of kidney, acquired: Secondary | ICD-10-CM

## 2012-01-28 NOTE — Telephone Encounter (Signed)
[facility] called and they need a new order for abd  MRI w/wo contrast to eval for  cyst

## 2012-01-31 ENCOUNTER — Other Ambulatory Visit: Payer: Self-pay | Admitting: Otolaryngology

## 2012-01-31 ENCOUNTER — Telehealth: Payer: Self-pay | Admitting: *Deleted

## 2012-01-31 DIAGNOSIS — N281 Cyst of kidney, acquired: Secondary | ICD-10-CM

## 2012-02-03 ENCOUNTER — Other Ambulatory Visit: Payer: Self-pay | Admitting: *Deleted

## 2012-02-03 MED ORDER — CLONAZEPAM 0.5 MG PO TABS: 0.5000 mg | ORAL_TABLET | Freq: Two times a day (BID) | ORAL | Status: DC | PRN

## 2012-02-03 MED ORDER — HYDROCODONE-IBUPROFEN 7.5-200 MG PO TABS: 1.0000 | ORAL_TABLET | Freq: Three times a day (TID) | ORAL | Status: DC | PRN

## 2012-02-04 ENCOUNTER — Ambulatory Visit
Admission: RE | Admit: 2012-02-04 | Discharge: 2012-02-04 | Disposition: A | Discharge status: Home / Self Care | Payer: BC Managed Care – PPO | Source: Ambulatory Visit | Attending: Family Medicine | Admitting: Family Medicine

## 2012-02-04 DIAGNOSIS — R0602 Shortness of breath: Secondary | ICD-10-CM

## 2012-02-07 ENCOUNTER — Telehealth: Payer: Self-pay | Admitting: *Deleted

## 2012-02-07 DIAGNOSIS — N281 Cyst of kidney, acquired: Secondary | ICD-10-CM

## 2012-02-14 ENCOUNTER — Other Ambulatory Visit: Payer: BC Managed Care – PPO

## 2012-02-14 ENCOUNTER — Telehealth: Payer: Self-pay | Admitting: *Deleted

## 2012-02-15 ENCOUNTER — Other Ambulatory Visit: Payer: BC Managed Care – PPO

## 2012-03-02 ENCOUNTER — Other Ambulatory Visit: Payer: Self-pay | Admitting: *Deleted

## 2012-03-02 ENCOUNTER — Telehealth: Payer: Self-pay | Admitting: *Deleted

## 2012-03-02 DIAGNOSIS — N281 Cyst of kidney, acquired: Secondary | ICD-10-CM

## 2012-03-02 MED ORDER — HYDROCODONE-IBUPROFEN 7.5-200 MG PO TABS: 1.0000 | ORAL_TABLET | Freq: Three times a day (TID) | ORAL | Status: DC | PRN

## 2012-03-02 MED ORDER — CLONAZEPAM 0.5 MG PO TABS: 0.5000 mg | ORAL_TABLET | Freq: Two times a day (BID) | ORAL | Status: DC | PRN

## 2012-03-02 NOTE — Telephone Encounter (Signed)
Pt has canceled the order for MRI to eval  The cyst on her kidney because it was too expensive and wants to know if there is another immaging procedure that can be done that not as expensive for her

## 2012-03-02 NOTE — Telephone Encounter (Signed)
I am not sure. We could look with a renal CT scan but the preferred imaging is an MRI. Please call [facility] and see if a dedicated renal CT is cheaper than a renal MRI. Thank you.

## 2012-03-03 ENCOUNTER — Telehealth: Payer: Self-pay | Admitting: *Deleted

## 2012-03-03 NOTE — Telephone Encounter (Signed)
Addended by: Nani Gasser D on: 03/03/2012 12:08 PM   Modules accepted: Orders

## 2012-03-03 NOTE — Telephone Encounter (Signed)
[facility] Radiologist states that a CT Scan with and without contrast would work and cost less.

## 2012-03-03 NOTE — Telephone Encounter (Signed)
CT scheduled for 5/13 @1030 . Left message on vm for pt to come by [place] office and pick up contrast

## 2012-03-06 ENCOUNTER — Inpatient Hospital Stay: Admission: RE | Admit: 2012-03-06 | Payer: BC Managed Care – PPO | Source: Ambulatory Visit

## 2012-03-08 ENCOUNTER — Telehealth: Payer: Self-pay | Admitting: *Deleted

## 2012-03-08 NOTE — Telephone Encounter (Signed)
Prior Auth done for CT ABD w & w/o contrast: Order ID [id number] Valid Dates: 03/08/12 to 04/06/12.

## 2012-03-13 ENCOUNTER — Other Ambulatory Visit: Payer: BC Managed Care – PPO

## 2012-03-30 ENCOUNTER — Other Ambulatory Visit: Payer: Self-pay | Admitting: Family Medicine

## 2012-03-30 ENCOUNTER — Other Ambulatory Visit: Payer: Self-pay | Admitting: *Deleted

## 2012-03-30 MED ORDER — CLONAZEPAM 0.5 MG PO TABS: 0.5000 mg | ORAL_TABLET | Freq: Two times a day (BID) | ORAL | Status: DC | PRN

## 2012-03-30 MED ORDER — HYDROCODONE-IBUPROFEN 7.5-200 MG PO TABS: 1.0000 | ORAL_TABLET | Freq: Three times a day (TID) | ORAL | Status: DC | PRN

## 2012-04-28 ENCOUNTER — Other Ambulatory Visit: Payer: Self-pay | Admitting: *Deleted

## 2012-04-28 MED ORDER — HYDROCODONE-IBUPROFEN 7.5-200 MG PO TABS: 1.0000 | ORAL_TABLET | Freq: Three times a day (TID) | ORAL | Status: DC | PRN

## 2012-04-28 MED ORDER — CLONAZEPAM 0.5 MG PO TABS: 0.5000 mg | ORAL_TABLET | Freq: Two times a day (BID) | ORAL | Status: DC | PRN

## 2012-05-29 ENCOUNTER — Other Ambulatory Visit: Payer: Self-pay | Admitting: *Deleted

## 2012-05-29 MED ORDER — HYDROCODONE-IBUPROFEN 7.5-200 MG PO TABS: 1.0000 | ORAL_TABLET | Freq: Three times a day (TID) | ORAL | Status: DC | PRN

## 2012-05-29 MED ORDER — CLONAZEPAM 0.5 MG PO TABS: 0.5000 mg | ORAL_TABLET | Freq: Two times a day (BID) | ORAL | Status: DC | PRN

## 2012-06-28 ENCOUNTER — Other Ambulatory Visit: Payer: Self-pay | Admitting: Family Medicine

## 2012-06-28 ENCOUNTER — Other Ambulatory Visit: Payer: Self-pay | Admitting: *Deleted

## 2012-06-28 MED ORDER — CLONAZEPAM 0.5 MG PO TABS: 0.5000 mg | ORAL_TABLET | Freq: Two times a day (BID) | ORAL | Status: DC | PRN

## 2012-06-28 MED ORDER — HYDROCODONE-IBUPROFEN 7.5-200 MG PO TABS: 1.0000 | ORAL_TABLET | Freq: Three times a day (TID) | ORAL | Status: DC | PRN

## 2012-07-11 ENCOUNTER — Telehealth: Payer: Self-pay | Admitting: *Deleted

## 2012-07-27 ENCOUNTER — Other Ambulatory Visit: Payer: Self-pay | Admitting: Family Medicine

## 2012-08-25 ENCOUNTER — Other Ambulatory Visit: Payer: Self-pay | Admitting: Family Medicine

## 2012-09-01 ENCOUNTER — Encounter: Payer: Self-pay | Admitting: Family Medicine

## 2012-09-01 ENCOUNTER — Ambulatory Visit (INDEPENDENT_AMBULATORY_CARE_PROVIDER_SITE_OTHER): Payer: BC Managed Care – PPO | Admitting: Family Medicine

## 2012-09-01 ENCOUNTER — Ambulatory Visit: Payer: BC Managed Care – PPO | Admitting: Family Medicine

## 2012-09-01 VITALS — BP 135/82 | HR 78 | Ht 67.0 in | Wt 157.0 lb

## 2012-09-01 DIAGNOSIS — K760 Fatty (change of) liver, not elsewhere classified: Secondary | ICD-10-CM

## 2012-09-01 DIAGNOSIS — R748 Abnormal levels of other serum enzymes: Secondary | ICD-10-CM

## 2012-09-01 DIAGNOSIS — R7401 Elevation of levels of liver transaminase levels: Secondary | ICD-10-CM

## 2012-09-01 DIAGNOSIS — M797 Fibromyalgia: Secondary | ICD-10-CM

## 2012-09-01 DIAGNOSIS — K7689 Other specified diseases of liver: Secondary | ICD-10-CM

## 2012-09-01 DIAGNOSIS — IMO0001 Reserved for inherently not codable concepts without codable children: Secondary | ICD-10-CM

## 2012-09-01 DIAGNOSIS — E785 Hyperlipidemia, unspecified: Secondary | ICD-10-CM

## 2012-09-01 LAB — LIPID PANEL
LDL Cholesterol: 140 mg/dL — ABNORMAL HIGH (ref 0–99)
Total CHOL/HDL Ratio: 5.2 Ratio
VLDL: 49 mg/dL — ABNORMAL HIGH (ref 0–40)

## 2012-09-01 LAB — COMPLETE METABOLIC PANEL WITH GFR
ALT: 35 U/L (ref 0–35)
AST: 31 U/L (ref 0–37)
Albumin: 4.1 g/dL (ref 3.5–5.2)
Alkaline Phosphatase: 48 U/L (ref 39–117)
Potassium: 4.4 mEq/L (ref 3.5–5.3)
Sodium: 141 mEq/L (ref 135–145)
Total Protein: 6.7 g/dL (ref 6.0–8.3)

## 2012-09-01 NOTE — Patient Instructions (Addendum)

## 2012-09-01 NOTE — Progress Notes (Signed)
  Subjective:    Patient ID: Lori [name], female    DOB: Jun 14, 1961, 51 y.o.   MRN: [id number]  HPI Hyperlipidemia - says had more myalgias on the 2mg  livalo. Felt better on the 1mg  livalo.  Elevated liver enzymes - they wre elevate in Feb and we asked her to decrease her tylenol and they did come down some but not back normal.    Fibromyalgia she has not been able to control her pain as well since she had to back off on her Tylenol.  Review of Systems     Objective:   Physical Exam  Constitutional: She is oriented to person, place, and time. She appears well-developed and well-nourished.  HENT:  Head: Normocephalic and atraumatic.  Cardiovascular: Normal rate, regular rhythm and normal heart sounds.   Pulmonary/Chest: Effort normal and breath sounds normal.  Abdominal: Soft. Bowel sounds are normal. She exhibits no distension and no mass. There is no tenderness. There is no rebound and no guarding.       Unable to palpate the liver edge   Neurological: She is alert and oriented to person, place, and time.  Skin: Skin is warm and dry.  Psychiatric: She has a normal mood and affect. Her behavior is normal.          Assessment & Plan:  Hyperlipidemia-she can cut her 2 mg tablets in half. When we need to renew the prescription we can change to 1 mg. She did feel better on this dose we will continue that. I explained her that I would rather have her on a lower dose even if her numbers are exactly where I would like him to be in to be on nothing at all.  Fibromyalgia-she's been very upset she hasn't been able to take her usual amount of Tylenol because of her elevated liver enzymes. She says that her mainstay of pain control. Explained her she did have fatty liver on her ultrasound that she had done back in March. And not really working on controlling her diet can make a big difference in her liver enzymes. Which may in turn lower her to use more of her Tylenol for her  pain.  Elevated liver enzymes-  Explained her she did have fatty liver on her ultrasound that she had done back in March. And not really working on controlling her diet can make a big difference in her liver enzymes. Which may in turn lower her to use more of her Tylenol for her pain.

## 2012-09-22 ENCOUNTER — Other Ambulatory Visit: Payer: Self-pay | Admitting: Family Medicine

## 2012-09-25 ENCOUNTER — Other Ambulatory Visit: Payer: Self-pay | Admitting: *Deleted

## 2012-10-23 ENCOUNTER — Other Ambulatory Visit: Payer: Self-pay | Admitting: Family Medicine

## 2012-11-14 ENCOUNTER — Telehealth: Payer: Self-pay | Admitting: *Deleted

## 2012-11-14 ENCOUNTER — Other Ambulatory Visit: Payer: Self-pay | Admitting: Family Medicine

## 2012-11-14 MED ORDER — CLONAZEPAM 0.5 MG PO TABS: 0.5000 mg | ORAL_TABLET | Freq: Two times a day (BID) | ORAL | Status: DC | PRN

## 2012-11-14 MED ORDER — ESTROGENS CONJUGATED 0.625 MG PO TABS: 0.6250 mg | ORAL_TABLET | Freq: Every day | ORAL | Status: DC

## 2012-11-14 MED ORDER — HYDROCODONE-IBUPROFEN 7.5-200 MG PO TABS: 1.0000 | ORAL_TABLET | Freq: Three times a day (TID) | ORAL | Status: DC | PRN

## 2012-11-14 NOTE — Telephone Encounter (Signed)
Pt notifed.

## 2012-11-14 NOTE — Telephone Encounter (Signed)
Ok to fill early this once.
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
## 2012-11-22 ENCOUNTER — Telehealth: Payer: Self-pay | Admitting: *Deleted
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
## 2012-11-23 NOTE — Telephone Encounter (Signed)
Called pt and lmovm informing her that she will not get a refill on her meds until they are due!

## 2012-11-24 ENCOUNTER — Other Ambulatory Visit: Payer: Self-pay | Admitting: *Deleted

## 2012-11-24 MED ORDER — PROMETHAZINE HCL 25 MG PO TABS: 25.0000 mg | ORAL_TABLET | Freq: Four times a day (QID) | ORAL | Status: DC | PRN

## 2012-11-24 NOTE — Telephone Encounter (Signed)
Called pt to inform her that med has been sent to [facility] for nausea.[name] [place]

## 2012-12-13 ENCOUNTER — Other Ambulatory Visit: Payer: Self-pay | Admitting: Family Medicine

## 2013-01-08 ENCOUNTER — Other Ambulatory Visit: Payer: Self-pay | Admitting: Family Medicine

## 2013-01-10 ENCOUNTER — Telehealth: Payer: Self-pay | Admitting: *Deleted

## 2013-01-10 NOTE — Telephone Encounter (Signed)
Called pt and lmovm informing her that her ins co denied her request for livalo. She will have to try an alternate med. Wanted to know if she would be ok with trying Crestor. Asked for return call.[name] [place]

## 2013-01-11 ENCOUNTER — Other Ambulatory Visit: Payer: Self-pay | Admitting: Family Medicine

## 2013-01-12 MED ORDER — ATORVASTATIN CALCIUM 40 MG PO TABS: 40.0000 mg | ORAL_TABLET | Freq: Every day | ORAL | Status: DC

## 2013-01-12 NOTE — Telephone Encounter (Signed)
Called pt and she informed me that the crestor works great however it causes her to have severe muscle pain. lipitor  Called into pharmacy.Lori [name]

## 2013-02-06 ENCOUNTER — Other Ambulatory Visit: Payer: Self-pay | Admitting: *Deleted

## 2013-02-06 MED ORDER — HYDROCODONE-IBUPROFEN 7.5-200 MG PO TABS: 1.0000 | ORAL_TABLET | Freq: Three times a day (TID) | ORAL | Status: DC | PRN

## 2013-02-06 MED ORDER — CLONAZEPAM 0.5 MG PO TABS: 0.5000 mg | ORAL_TABLET | Freq: Two times a day (BID) | ORAL | Status: DC | PRN

## 2013-02-06 NOTE — Telephone Encounter (Signed)
Pt will only get enough meds to last until her appt on 4.24 she MUST be seen in order to get future refills.Lori [name]

## 2013-02-12 ENCOUNTER — Encounter: Payer: Self-pay | Admitting: *Deleted

## 2013-02-15 ENCOUNTER — Telehealth: Payer: Self-pay | Admitting: *Deleted

## 2013-02-15 ENCOUNTER — Encounter: Payer: Self-pay | Admitting: Family Medicine

## 2013-02-15 ENCOUNTER — Ambulatory Visit (INDEPENDENT_AMBULATORY_CARE_PROVIDER_SITE_OTHER): Payer: BC Managed Care – PPO | Admitting: Family Medicine

## 2013-02-15 VITALS — BP 121/68 | HR 80 | Ht 67.0 in | Wt 154.0 lb

## 2013-02-15 DIAGNOSIS — E785 Hyperlipidemia, unspecified: Secondary | ICD-10-CM

## 2013-02-15 DIAGNOSIS — F429 Obsessive-compulsive disorder, unspecified: Secondary | ICD-10-CM

## 2013-02-15 DIAGNOSIS — E559 Vitamin D deficiency, unspecified: Secondary | ICD-10-CM

## 2013-02-15 DIAGNOSIS — IMO0001 Reserved for inherently not codable concepts without codable children: Secondary | ICD-10-CM

## 2013-02-15 LAB — COMPLETE METABOLIC PANEL WITH GFR
ALT: 21 U/L (ref 0–35)
Albumin: 4.3 g/dL (ref 3.5–5.2)
Alkaline Phosphatase: 49 U/L (ref 39–117)
CO2: 27 mEq/L (ref 19–32)
GFR, Est Non African American: >89 mL/min
Glucose, Bld: 82 mg/dL (ref 70–99)
Potassium: 4.4 mEq/L (ref 3.5–5.3)
Sodium: 141 mEq/L (ref 135–145)
Total Protein: 7.1 g/dL (ref 6.0–8.3)

## 2013-02-15 LAB — LIPID PANEL
LDL Cholesterol: 161 mg/dL — ABNORMAL HIGH (ref 0–99)
Total CHOL/HDL Ratio: 6.2 Ratio

## 2013-02-15 MED ORDER — CLONAZEPAM 0.5 MG PO TABS: 0.5000 mg | ORAL_TABLET | Freq: Two times a day (BID) | ORAL | Status: DC | PRN

## 2013-02-15 MED ORDER — BUPROPION HCL ER (SR) 150 MG PO TB12: 150.0000 mg | ORAL_TABLET | Freq: Two times a day (BID) | ORAL | Status: DC

## 2013-02-15 MED ORDER — HYDROCODONE-IBUPROFEN 7.5-200 MG PO TABS: 1.0000 | ORAL_TABLET | Freq: Three times a day (TID) | ORAL | Status: DC | PRN

## 2013-02-15 NOTE — Progress Notes (Signed)
  Subjective:    Patient ID: Lori [name], female    DOB: Aug 29, 1961, 52 y.o.   MRN: [id number]  HPI Hyperlipidemia- Says the lipitor is making her more aching.  Says felt much better on livalo but her insurance won't cover it.   Fibromyalgia - Still dong yoga and pilates daily and hot bathes and tylenol.  Uses hydrocodone about BID a day.  Sleep is good right now.  Her right knee hurts today.    Vit D - She is taking maintenance dose supplement.    Tobacco abuse-she did start smoking again recently. Encourage cessation. She's been successful with nicotine patches in the past but is interested in a medication. She currently is a light smoker and smokes about 5 cigarettes per day Review of Systems     Objective:   Physical Exam  Constitutional: She is oriented to person, place, and time. She appears well-developed and well-nourished.  HENT:  Head: Normocephalic and atraumatic.  Eyes: Conjunctivae are normal. Pupils are equal, round, and reactive to light.  Neck: Neck supple. No thyromegaly present.  Cardiovascular: Normal rate, regular rhythm and normal heart sounds.   Pulmonary/Chest: Effort normal and breath sounds normal.  Lymphadenopathy:    She has no cervical adenopathy.  Neurological: She is alert and oriented to person, place, and time. She has normal reflexes.  Skin: Skin is warm and dry.  Psychiatric: She has a normal mood and affect. Her behavior is normal.          Assessment & Plan:  Hyperlidemia  - she is not doing as well on the Lipitor but not as bad as the Crestor. I encouraged her to finish at least one month, Lipitor. She still doesn't improve then asked her call back so we can try to write for the Livalo again. It will likely require prior authorization.   Fibromyalgia - has been exercising daily.  On zoloft and doing Tyelnol.  Using her hydrocodone BID. She does have a pain contract that was initially signed with Dr. Seymour [name]. We may need to get this  updated [name] forms when I see her back in 6 months. She is doing well and no signs of abuse of medication.  Anxiety and OCD-doing well on Zoloft. We did refill her Xanax today.  Tobacco abuse-she did start smoking again recently. Encourage cessation. She's been successful with nicotine patches in the past but is interested in a medication. She currently is a light smoker and smokes about 5 cigarettes per day. We discussed option of Chantix versus Wellbutrin. She would like to try the Wellbutrin first. New prescriptions sent to the pharmacy call if any problems.  Vit D - Due to recheck.  Says she went for labs today.

## 2013-02-15 NOTE — Telephone Encounter (Signed)
Labs entered. Kimberly Gordon, LPN  

## 2013-02-16 LAB — VITAMIN D 25 HYDROXY (VIT D DEFICIENCY, FRACTURES): Vit D, 25-Hydroxy: 22 ng/mL — ABNORMAL LOW (ref 30–89)

## 2013-03-14 ENCOUNTER — Other Ambulatory Visit: Payer: Self-pay | Admitting: Family Medicine

## 2013-03-14 MED ORDER — VARENICLINE TARTRATE 0.5 MG X 11 & 1 MG X 42 PO MISC: ORAL | Status: DC

## 2013-03-14 MED ORDER — CLONAZEPAM 0.5 MG PO TABS: ORAL_TABLET | ORAL | Status: DC

## 2013-03-14 MED ORDER — PITAVASTATIN CALCIUM 2 MG PO TABS: ORAL_TABLET | ORAL | Status: DC

## 2013-03-14 NOTE — Telephone Encounter (Addendum)
Does that mean she is having Side effects?  Or we may need to adjust dose?  If having side effects then we should be able to move forward with a prior off for Livalo since she has tried two preferred agents. We can try the chantix.  Will send over new rx.

## 2013-03-14 NOTE — Telephone Encounter (Signed)
Pt stated that she has tried the Lipitor and it is still not working for her she has also tried crestor in the past. Her ins co. Will require a PA for livalo.  Also she stated the wellbutrin is making her feel odd she would like to try chantix instead. She states the wellbutrin did help some just made her feel odd.Laureen Ochs, Viann Shove

## 2013-03-15 ENCOUNTER — Other Ambulatory Visit: Payer: Self-pay | Admitting: Family Medicine

## 2013-03-15 NOTE — Telephone Encounter (Signed)
Ok lets start the prior authorization for the livalo.

## 2013-03-20 ENCOUNTER — Other Ambulatory Visit: Payer: Self-pay | Admitting: Family Medicine

## 2013-04-11 ENCOUNTER — Other Ambulatory Visit: Payer: Self-pay | Admitting: Family Medicine

## 2013-04-12 ENCOUNTER — Other Ambulatory Visit: Payer: Self-pay | Admitting: *Deleted

## 2013-04-12 MED ORDER — ESTROGENS CONJUGATED 0.625 MG PO TABS: 0.6250 mg | ORAL_TABLET | Freq: Every day | ORAL | Status: DC

## 2013-04-13 ENCOUNTER — Other Ambulatory Visit: Payer: Self-pay | Admitting: Family Medicine

## 2013-05-10 ENCOUNTER — Other Ambulatory Visit: Payer: Self-pay | Admitting: Physician Assistant

## 2013-05-10 ENCOUNTER — Other Ambulatory Visit: Payer: Self-pay | Admitting: Family Medicine

## 2013-05-11 ENCOUNTER — Other Ambulatory Visit: Payer: Self-pay | Admitting: Physician Assistant

## 2013-05-15 ENCOUNTER — Ambulatory Visit: Payer: Self-pay | Admitting: Family Medicine

## 2013-05-15 LAB — RAPID STREP-A WITH REFLX: Micro Text Report: NEGATIVE

## 2013-05-18 LAB — BETA STREP CULTURE(ARMC)

## 2013-05-24 ENCOUNTER — Ambulatory Visit: Payer: Self-pay

## 2013-05-30 ENCOUNTER — Other Ambulatory Visit: Payer: Self-pay | Admitting: *Deleted

## 2013-05-30 ENCOUNTER — Other Ambulatory Visit: Payer: Self-pay | Admitting: Family Medicine

## 2013-05-30 MED ORDER — VARENICLINE TARTRATE 0.5 MG X 11 & 1 MG X 42 PO MISC: ORAL | Status: DC

## 2013-05-30 MED ORDER — ESTROGENS CONJUGATED 0.625 MG PO TABS: 0.6250 mg | ORAL_TABLET | Freq: Every day | ORAL | Status: DC

## 2013-05-30 NOTE — Telephone Encounter (Signed)
Called pt and informed her that rx has been sent.[name] [place]

## 2013-06-07 ENCOUNTER — Other Ambulatory Visit: Payer: Self-pay | Admitting: Family Medicine

## 2013-07-06 ENCOUNTER — Other Ambulatory Visit: Payer: Self-pay | Admitting: *Deleted

## 2013-07-06 ENCOUNTER — Other Ambulatory Visit: Payer: Self-pay | Admitting: Family Medicine

## 2013-07-06 MED ORDER — VARENICLINE TARTRATE 1 MG PO TABS: 1.0000 mg | ORAL_TABLET | Freq: Two times a day (BID) | ORAL | Status: DC

## 2013-08-01 ENCOUNTER — Telehealth: Payer: Self-pay | Admitting: *Deleted

## 2013-08-01 NOTE — Telephone Encounter (Signed)
Let call to get orthopedic records.  I don't really know anything about her specific injury so I don't know how best to address her pain.  Also the shot she got should be helping by this evening.

## 2013-08-01 NOTE — Telephone Encounter (Signed)
Pt called to inform that she fell and went to [facility] to be seen and she was given something for pain and she informed them that she has a pain contract with Dr.Metheney she received 5 pills. She stated that they are going to call her to be seen by an orthopedist this morning but she wanted Dr.Metheney to know so she wouldn't get in any trouble about her medications.Lori [name] [place]

## 2013-08-01 NOTE — Telephone Encounter (Signed)
Pt called back very tearful stating that she was given an injection in her shoulder and she was in a lot of pain and could not sleep and was still in a great amount of pain and would like for something to be called in for her to help her to sleep and take the pain away. I told her that I would forward this to Dr.Metheney. Pt voiced understanding .[name]

## 2013-08-02 NOTE — Telephone Encounter (Signed)
Pt called and stated that the injection that she was given actually started working and she feels much better and apologized for her behavior. She said that she does not need any additional pain or sleep meds however, she will need a refill that is due. I informed her that she will need to come by the office to p/u due to new rules set by FDA with regards to narcotic RX. Pt voiced understanding. I will call [facility] to get records.[name] [facility]

## 2013-08-07 ENCOUNTER — Other Ambulatory Visit: Payer: Self-pay | Admitting: *Deleted

## 2013-08-07 MED ORDER — HYDROCODONE-IBUPROFEN 7.5-200 MG PO TABS: ORAL_TABLET | ORAL | Status: DC

## 2013-08-07 MED ORDER — CLONAZEPAM 0.5 MG PO TABS: ORAL_TABLET | ORAL | Status: DC

## 2013-10-08 ENCOUNTER — Other Ambulatory Visit: Payer: Self-pay

## 2013-10-08 MED ORDER — CLONAZEPAM 0.5 MG PO TABS: ORAL_TABLET | ORAL | Status: DC

## 2013-10-08 MED ORDER — HYDROCODONE-IBUPROFEN 7.5-200 MG PO TABS: ORAL_TABLET | ORAL | Status: DC

## 2013-10-11 ENCOUNTER — Ambulatory Visit: Payer: BC Managed Care – PPO | Admitting: Family Medicine

## 2013-11-01 ENCOUNTER — Encounter: Payer: Self-pay | Admitting: Family Medicine

## 2013-11-01 ENCOUNTER — Ambulatory Visit (INDEPENDENT_AMBULATORY_CARE_PROVIDER_SITE_OTHER): Payer: BC Managed Care – PPO | Admitting: Family Medicine

## 2013-11-01 VITALS — BP 119/72 | HR 77 | Temp 97.9°F | Ht 67.0 in | Wt 157.0 lb

## 2013-11-01 DIAGNOSIS — IMO0001 Reserved for inherently not codable concepts without codable children: Secondary | ICD-10-CM

## 2013-11-01 DIAGNOSIS — F3289 Other specified depressive episodes: Secondary | ICD-10-CM

## 2013-11-01 DIAGNOSIS — E559 Vitamin D deficiency, unspecified: Secondary | ICD-10-CM

## 2013-11-01 DIAGNOSIS — F329 Major depressive disorder, single episode, unspecified: Secondary | ICD-10-CM

## 2013-11-01 DIAGNOSIS — E785 Hyperlipidemia, unspecified: Secondary | ICD-10-CM

## 2013-11-01 DIAGNOSIS — S0992XS Unspecified injury of nose, sequela: Secondary | ICD-10-CM

## 2013-11-01 MED ORDER — SERTRALINE HCL 100 MG PO TABS: ORAL_TABLET | ORAL | Status: DC

## 2013-11-01 MED ORDER — ESTROGENS CONJUGATED 0.625 MG PO TABS: 0.6250 mg | ORAL_TABLET | Freq: Every day | ORAL | Status: DC
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
## 2013-11-05 ENCOUNTER — Other Ambulatory Visit: Payer: Self-pay | Admitting: *Deleted

## 2013-11-05 ENCOUNTER — Telehealth: Payer: Self-pay | Admitting: *Deleted

## 2013-11-05 MED ORDER — HYDROCODONE-IBUPROFEN 7.5-200 MG PO TABS: ORAL_TABLET | ORAL | Status: DC

## 2013-11-05 NOTE — Telephone Encounter (Signed)
Pt informed that rx will need to be p/u.Lori [name].Lori PacasBarkley, Lori Knobloch HooperLynetta

## 2013-12-04 ENCOUNTER — Other Ambulatory Visit: Payer: Self-pay | Admitting: *Deleted

## 2013-12-04 MED ORDER — HYDROCODONE-IBUPROFEN 7.5-200 MG PO TABS: ORAL_TABLET | ORAL | Status: DC

## 2013-12-04 MED ORDER — CLONAZEPAM 0.5 MG PO TABS: ORAL_TABLET | ORAL | Status: DC

## 2013-12-05 LAB — LIPID PANEL
Cholesterol: 265 mg/dL — ABNORMAL HIGH (ref 0–200)
HDL: 47 mg/dL (ref >39)
LDL CALC: 139 mg/dL — AB (ref 0–99)
Total CHOL/HDL Ratio: 5.6 Ratio
Triglycerides: 396 mg/dL — ABNORMAL HIGH (ref <150)
VLDL: 79 mg/dL — AB (ref 0–40)

## 2013-12-05 LAB — COMPLETE METABOLIC PANEL WITH GFR
ALBUMIN: 4.1 g/dL (ref 3.5–5.2)
ALT: 22 U/L (ref 0–35)
AST: 19 U/L (ref 0–37)
Alkaline Phosphatase: 55 U/L (ref 39–117)
BUN: 9 mg/dL (ref 6–23)
CALCIUM: 9.9 mg/dL (ref 8.4–10.5)
CO2: 30 mEq/L (ref 19–32)
Chloride: 103 mEq/L (ref 96–112)
Creat: 0.53 mg/dL (ref 0.50–1.10)
GFR, Est Non African American: >89 mL/min
Glucose, Bld: 84 mg/dL (ref 70–99)
POTASSIUM: 3.9 meq/L (ref 3.5–5.3)
Sodium: 140 mEq/L (ref 135–145)
Total Bilirubin: 0.4 mg/dL (ref 0.2–1.2)
Total Protein: 6.7 g/dL (ref 6.0–8.3)

## 2013-12-05 LAB — VITAMIN D 25 HYDROXY (VIT D DEFICIENCY, FRACTURES): Vit D, 25-Hydroxy: 37 ng/mL (ref 30–89)

## 2014-01-02 ENCOUNTER — Other Ambulatory Visit: Payer: Self-pay | Admitting: *Deleted

## 2014-01-02 MED ORDER — CLONAZEPAM 0.5 MG PO TABS: ORAL_TABLET | ORAL | Status: DC

## 2014-01-02 MED ORDER — HYDROCODONE-IBUPROFEN 7.5-200 MG PO TABS: ORAL_TABLET | ORAL | Status: DC

## 2014-02-04 ENCOUNTER — Telehealth: Payer: Self-pay | Admitting: *Deleted

## 2014-02-04 MED ORDER — CLONAZEPAM 0.5 MG PO TABS: ORAL_TABLET | ORAL | Status: DC

## 2014-02-04 MED ORDER — HYDROCODONE-IBUPROFEN 7.5-200 MG PO TABS: ORAL_TABLET | ORAL | Status: DC

## 2014-02-04 NOTE — Telephone Encounter (Signed)
Med refill.Lori [name] Lori [name]

## 2014-03-05 ENCOUNTER — Other Ambulatory Visit: Payer: Self-pay | Admitting: *Deleted

## 2014-03-05 MED ORDER — HYDROCODONE-IBUPROFEN 7.5-200 MG PO TABS: ORAL_TABLET | ORAL | Status: DC

## 2014-04-03 ENCOUNTER — Telehealth: Payer: Self-pay | Admitting: Family Medicine

## 2014-04-03 ENCOUNTER — Other Ambulatory Visit: Payer: Self-pay | Admitting: *Deleted

## 2014-04-03 MED ORDER — HYDROCODONE-IBUPROFEN 7.5-200 MG PO TABS: ORAL_TABLET | ORAL | Status: DC

## 2014-04-03 MED ORDER — CLONAZEPAM 0.5 MG PO TABS: ORAL_TABLET | ORAL | Status: DC

## 2014-04-03 NOTE — Telephone Encounter (Signed)
[name] called. She has a cpe scheduled for 6/24 and would like Order prepared for 6/23.  Thank you

## 2014-04-03 NOTE — Telephone Encounter (Signed)
Called pt and informed her that she had labs done in feb. No additional labs are needed

## 2014-04-05 ENCOUNTER — Other Ambulatory Visit: Payer: Self-pay | Admitting: Family Medicine

## 2014-04-17 ENCOUNTER — Encounter: Payer: Self-pay | Admitting: Family Medicine

## 2014-04-17 ENCOUNTER — Ambulatory Visit (INDEPENDENT_AMBULATORY_CARE_PROVIDER_SITE_OTHER): Payer: BC Managed Care – PPO | Admitting: Family Medicine

## 2014-04-17 VITALS — BP 110/69 | HR 76 | Ht 67.0 in | Wt 164.0 lb

## 2014-04-17 DIAGNOSIS — R300 Dysuria: Secondary | ICD-10-CM

## 2014-04-17 DIAGNOSIS — E785 Hyperlipidemia, unspecified: Secondary | ICD-10-CM

## 2014-04-17 DIAGNOSIS — R635 Abnormal weight gain: Secondary | ICD-10-CM

## 2014-04-17 DIAGNOSIS — Z0000 Encounter for general adult medical examination without abnormal findings: Secondary | ICD-10-CM

## 2014-04-17 LAB — POCT URINALYSIS DIPSTICK
Bilirubin, UA: NEGATIVE
Blood, UA: NEGATIVE
Glucose, UA: NEGATIVE
KETONES UA: NEGATIVE
LEUKOCYTES UA: NEGATIVE
Nitrite, UA: NEGATIVE
PROTEIN UA: NEGATIVE
Spec Grav, UA: 1.01
UROBILINOGEN UA: 0.2
pH, UA: 5.5

## 2014-04-17 NOTE — Progress Notes (Signed)
  Subjective:     Lori [name] is a 53 y.o. female and is here for a comprehensive physical exam. The patient reports problems - she has gained weight aftr quitting smoking 3 months ago. Marland Kitchen.  History   Social History  . Marital Status: Legally Separated    Spouse Name: N/A    Number of Children: 1  . Years of Education: N/A   Occupational History  . Self employed-office computer work    Social History Main Topics  . Smoking status: Former Smoker    Quit date: 10/26/1987  . Smokeless tobacco: Not on file  . Alcohol Use: No  . Drug Use: No  . Sexual Activity: Not on file   Other Topics Concern  . Not on file   Social History Narrative   Recently separated from husband (cheating)      1 child in college      Lives with mother      Does pilates 5 days/week         Health Maintenance  Topic Date Due  . Influenza Vaccine  05/25/2014  . Mammogram  06/25/2014  . Pap Smear  10/25/2014  . Tetanus/tdap  10/25/2016  . Colonoscopy  06/21/2021    The following portions of the patient's history were reviewed and updated as appropriate: allergies, current medications, past family history, past medical history, past social history, past surgical history and problem list.  Review of Systems A comprehensive review of systems was negative.   Objective:    BP 110/69  Pulse 76  Ht 5\' 7"  (1.702 m)  Wt 164 lb (74.39 kg)  BMI 25.68 kg/m2 General appearance: alert, cooperative and appears stated age Head: Normocephalic, without obvious abnormality, atraumatic Eyes: conj clear, EOMI, PEERLA Ears: normal TM's and external ear canals both ears Nose: Nares normal. Septum midline. Mucosa normal. No drainage or sinus tenderness. Throat: lips, mucosa, and tongue normal; teeth and gums normal Neck: no adenopathy, no carotid bruit, no JVD, supple, symmetrical, trachea midline and thyroid not enlarged, symmetric, no tenderness/mass/nodules Back: symmetric, no curvature. ROM normal. No  CVA tenderness. Lungs: clear to auscultation bilaterally Breasts: normal appearance, no masses or tenderness Heart: regular rate and rhythm, S1, S2 normal, no murmur, click, rub or gallop Abdomen: soft, non-tender; bowel sounds normal; no masses,  no organomegaly Extremities: extremities normal, atraumatic, no cyanosis or edema Pulses: 2+ and symmetric Skin: Skin color, texture, turgor normal. No rashes or lesions Lymph nodes: Cervical, supraclavicular, and axillary nodes normal. Neurologic: Alert and oriented X 3, normal strength and tone. Normal symmetric reflexes. Normal coordination and gait    Assessment:    Healthy female exam.      Plan:     See After Visit Summary for Counseling Recommendations  Keep up a regular exercise program and make sure you are eating a healthy diet Try to eat 4 servings of dairy a day, or if you are lactose intolerant take a calcium with vitamin D daily.  Your vaccines are up to date.  She declines colonoscopy and mammogram.  Weight gain - discussed myfitness pal. Continue to work out and exercise. I think if she starts watching her calories and she started to make some progress with the weight loss.  Tobacco abuse-she has stopped smoking which is fantastic and I congratulated her.

## 2014-04-17 NOTE — Patient Instructions (Addendum)
Keep up a regular exercise program and make sure you are eating a healthy diet Try to eat 4 servings of dairy a day, or if you are lactose intolerant take a calcium with vitamin D daily.  Your vaccines are up to date.  Call me when you are ready to do the mammogram.  Declined colonoscopy and mammogram today.  Discussed need for shingles vaccine. Additional handout with information provided. Encouraged her to check with the insurance company on coverage.

## 2014-05-01 ENCOUNTER — Other Ambulatory Visit: Payer: Self-pay | Admitting: *Deleted

## 2014-05-01 MED ORDER — SERTRALINE HCL 100 MG PO TABS: ORAL_TABLET | ORAL | Status: DC

## 2014-05-01 MED ORDER — ESTROGENS CONJUGATED 0.625 MG PO TABS: 0.6250 mg | ORAL_TABLET | Freq: Every day | ORAL | Status: DC

## 2014-05-01 MED ORDER — HYDROCODONE-IBUPROFEN 7.5-200 MG PO TABS: ORAL_TABLET | ORAL | Status: DC

## 2014-05-31 ENCOUNTER — Telehealth: Payer: Self-pay | Admitting: *Deleted

## 2014-05-31 MED ORDER — HYDROCODONE-IBUPROFEN 7.5-200 MG PO TABS: ORAL_TABLET | ORAL | Status: DC

## 2014-05-31 MED ORDER — CLONAZEPAM 0.5 MG PO TABS: ORAL_TABLET | ORAL | Status: DC

## 2014-05-31 NOTE — Telephone Encounter (Signed)
Yes, I agree tonya.  She should get interim meds for post op from the surgeon.  If her feels her current dose is adequate then he may not give her more.  Also will need ot make sure not mixing meds if he gives her some for the few days post op.

## 2014-05-31 NOTE — Telephone Encounter (Signed)
Pt called to inform Dr. Linford ArnoldMetheney that she will be getting surgery on her nose later this month and she was wanting to know what she has to do about her pain meds. I informed her that usually the surgeon will take care of her pain meds up until the time she is discharged from care or they may work in conjunction with Dr. Linford ArnoldMetheney to control her pain.  Pt stated that she didn't want to mess things up with her pain contract and wanted to get this figured out.

## 2014-06-24 ENCOUNTER — Encounter: Payer: Self-pay | Admitting: Family Medicine

## 2014-06-24 ENCOUNTER — Ambulatory Visit (INDEPENDENT_AMBULATORY_CARE_PROVIDER_SITE_OTHER): Payer: BC Managed Care – PPO | Admitting: Family Medicine

## 2014-06-24 ENCOUNTER — Ambulatory Visit: Payer: BC Managed Care – PPO | Admitting: Physician Assistant

## 2014-06-24 VITALS — BP 154/97 | HR 132 | Wt 160.0 lb

## 2014-06-24 DIAGNOSIS — G8918 Other acute postprocedural pain: Secondary | ICD-10-CM | POA: Diagnosis not present

## 2014-06-24 MED ORDER — OXYCODONE-ACETAMINOPHEN 5-325 MG PO TABS: 1.0000 | ORAL_TABLET | Freq: Four times a day (QID) | ORAL | Status: DC | PRN

## 2014-06-24 MED ORDER — PROMETHAZINE HCL 25 MG PO TABS: 25.0000 mg | ORAL_TABLET | Freq: Three times a day (TID) | ORAL | Status: DC | PRN

## 2014-06-24 NOTE — Progress Notes (Addendum)
   Subjective:    Patient ID: Lori [name], female    DOB: Jan 24, 1961, 53 y.o.   MRN: [id number]  HPI Had surgery done on Friday for her nose.  She was given 20 tabs of oxycodone for post op pain. Unfortunately she vomited multiples on Sat ( day after surgery). She didn't take vomit yesterday.  No fever. Post op check is on Thursday.  She is on antibiotic.  She now only has a couple of tablets left. She rates her pain as moderate to severe. She says she's had multiple nose surgeries this is the most complex.   Review of Systems     Objective:   Physical Exam  Constitutional: She is oriented to person, place, and time. She appears well-developed and well-nourished.  HENT:  Head: Normocephalic and atraumatic.  Neurological: She is alert and oriented to person, place, and time.  Skin: Skin is warm.  Psychiatric: She has a normal mood and affect. Her behavior is normal.          Assessment & Plan:  Post op pain control.  I will go ahead and refill the oxycodone since she is no longer vomiting. Will give for 40 tabs. This should last her until her appointment on Thursday. At that point the surgeon can call me if she feels like she needs to continue to be on main pain medications or food and wean off at that point. She's not due for her regular pain medication until next week. We'll also give her prescription for Phenergan. I suspect her vomiting was related to the anesthesia. Make sure staying well hydrated and starting to eat normally.

## 2014-07-02 ENCOUNTER — Other Ambulatory Visit: Payer: Self-pay | Admitting: *Deleted

## 2014-07-02 MED ORDER — SERTRALINE HCL 100 MG PO TABS: ORAL_TABLET | ORAL | Status: DC

## 2014-07-02 MED ORDER — CLONAZEPAM 0.5 MG PO TABS: ORAL_TABLET | ORAL | Status: DC

## 2014-07-02 MED ORDER — HYDROCODONE-IBUPROFEN 7.5-200 MG PO TABS: ORAL_TABLET | ORAL | Status: DC

## 2014-07-31 ENCOUNTER — Other Ambulatory Visit: Payer: Self-pay | Admitting: *Deleted

## 2014-07-31 MED ORDER — HYDROCODONE-IBUPROFEN 7.5-200 MG PO TABS: ORAL_TABLET | ORAL | Status: DC

## 2014-07-31 MED ORDER — CLONAZEPAM 0.5 MG PO TABS
ORAL_TABLET | ORAL | Status: DC
Start: 2014-07-31 — End: 2014-10-02

## 2014-08-26 ENCOUNTER — Other Ambulatory Visit: Payer: Self-pay | Admitting: Family Medicine

## 2014-09-02 ENCOUNTER — Other Ambulatory Visit: Payer: Self-pay | Admitting: *Deleted

## 2014-09-02 MED ORDER — HYDROCODONE-IBUPROFEN 7.5-200 MG PO TABS: ORAL_TABLET | ORAL | Status: DC

## 2014-10-02 ENCOUNTER — Other Ambulatory Visit: Payer: Self-pay | Admitting: *Deleted

## 2014-10-02 ENCOUNTER — Telehealth: Payer: Self-pay | Admitting: *Deleted

## 2014-10-02 MED ORDER — HYDROCODONE-IBUPROFEN 7.5-200 MG PO TABS
ORAL_TABLET | ORAL | Status: DC
Start: 2014-10-02 — End: 2014-10-21

## 2014-10-02 MED ORDER — CLONAZEPAM 0.5 MG PO TABS: ORAL_TABLET | ORAL | Status: DC

## 2014-10-02 NOTE — Telephone Encounter (Signed)
Called pt and lvm informing her that she will need to call back and schedule an appt for her 6 mos f/u.Loralee PacasBarkley, Emmali Karow [place]Lyn[place]

## 2014-10-02 NOTE — Telephone Encounter (Signed)
Voicemail left for Lori MessierKathy that pain med script was at front desk waiting for pick up. Lori SkainsJamie Decker [name], CMA

## 2014-10-21 ENCOUNTER — Encounter: Payer: Self-pay | Admitting: Family Medicine

## 2014-10-21 ENCOUNTER — Other Ambulatory Visit: Payer: Self-pay | Admitting: Family Medicine

## 2014-10-21 ENCOUNTER — Ambulatory Visit (INDEPENDENT_AMBULATORY_CARE_PROVIDER_SITE_OTHER): Payer: BC Managed Care – PPO | Admitting: Family Medicine

## 2014-10-21 VITALS — BP 146/80 | HR 78 | Ht 67.0 in | Wt 168.0 lb

## 2014-10-21 DIAGNOSIS — E785 Hyperlipidemia, unspecified: Secondary | ICD-10-CM

## 2014-10-21 DIAGNOSIS — K760 Fatty (change of) liver, not elsewhere classified: Secondary | ICD-10-CM

## 2014-10-21 DIAGNOSIS — F329 Major depressive disorder, single episode, unspecified: Secondary | ICD-10-CM

## 2014-10-21 DIAGNOSIS — F32A Depression, unspecified: Secondary | ICD-10-CM

## 2014-10-21 DIAGNOSIS — M797 Fibromyalgia: Secondary | ICD-10-CM

## 2014-10-21 DIAGNOSIS — Z114 Encounter for screening for human immunodeficiency virus [HIV]: Secondary | ICD-10-CM | POA: Diagnosis not present

## 2014-10-21 LAB — COMPLETE METABOLIC PANEL WITH GFR
ALT: 97 U/L — AB (ref 0–35)
AST: 115 U/L — ABNORMAL HIGH (ref 0–37)
Albumin: 4.3 g/dL (ref 3.5–5.2)
Alkaline Phosphatase: 77 U/L (ref 39–117)
BILIRUBIN TOTAL: 0.4 mg/dL (ref 0.2–1.2)
BUN: 10 mg/dL (ref 6–23)
CO2: 24 meq/L (ref 19–32)
Calcium: 9.9 mg/dL (ref 8.4–10.5)
Chloride: 104 mEq/L (ref 96–112)
Creat: 0.63 mg/dL (ref 0.50–1.10)
GFR, Est African American: >89 mL/min
GFR, Est Non African American: >89 mL/min
Glucose, Bld: 86 mg/dL (ref 70–99)
Potassium: 3.9 mEq/L (ref 3.5–5.3)
SODIUM: 138 meq/L (ref 135–145)
Total Protein: 7.1 g/dL (ref 6.0–8.3)

## 2014-10-21 LAB — LIPID PANEL
CHOLESTEROL: 269 mg/dL — AB (ref 0–200)
HDL: 39 mg/dL — ABNORMAL LOW (ref >39)
Total CHOL/HDL Ratio: 6.9 Ratio
Triglycerides: 441 mg/dL — ABNORMAL HIGH (ref <150)

## 2014-10-21 MED ORDER — ESTROGENS CONJUGATED 0.625 MG PO TABS: 0.6250 mg | ORAL_TABLET | Freq: Every day | ORAL | Status: DC

## 2014-10-21 MED ORDER — SERTRALINE HCL 100 MG PO TABS: 100.0000 mg | ORAL_TABLET | Freq: Every day | ORAL | Status: DC

## 2014-10-21 MED ORDER — PITAVASTATIN CALCIUM 2 MG PO TABS: 2.0000 mg | ORAL_TABLET | Freq: Every day | ORAL | Status: DC

## 2014-10-21 MED ORDER — HYDROCODONE-IBUPROFEN 7.5-200 MG PO TABS: ORAL_TABLET | ORAL | Status: DC

## 2014-10-21 NOTE — Progress Notes (Signed)
   Subjective:    Patient ID: Lori [name], female    DOB: [birth year], 53 y.o.   MRN: [id number]  HPI F/U depression - doing well on sertraline and clonazepam. Her PHQ 9 score is 1 today. She claims of some difficulty with concentration. She does feel she has a lot to do.  Her previous score was 3.   Hyperlipidemia-due to recheck lipid panel. Last lipids were in February 2015. At that time her LDL and triglycerides were significantly elevated. She had managed to bring them down but still above goal. She does't take the cholesterol medications bc of S.E. But says she know she needs to take it.   Generalized osteoarthritis/fibromyalgia-she would like a refill on her Vicoprofen couple of days early because she is traveling for the holidays. Overall she's doing okay. No recent exacerbations or flares. Review of Systems     Objective:   Physical Exam  Constitutional: She is oriented to person, place, and time. She appears well-developed and well-nourished.  HENT:  Head: Normocephalic and atraumatic.  Cardiovascular: Normal rate, regular rhythm and normal heart sounds.   Pulmonary/Chest: Effort normal and breath sounds normal.  Neurological: She is alert and oriented to person, place, and time.  Skin: Skin is warm and dry.  Psychiatric: She has a normal mood and affect. Her behavior is normal.          Assessment & Plan:  Depression-well-controlled on current regimen. Follow-up in 6 months. Next  Hyperlipidemia-due to repeat lipid panel and CMP. She says she did the best with Livalo in the past. Unfortunately it got very costly with her insurance. Will send over new prescription. It can be processed after January 1 with her new insurance. It may require prior authorization and likely well and so provided patient with samples today. Also provided her with a coupon card that will help bring her co-pay down to reasonable level.  Fibromyalgia/osteoarthritis-we'll go ahead and fill her  medications a little early this time. She will only fill it about 2 days before she is actually due. Follow-up in 2 months.

## 2014-10-22 LAB — HIV ANTIBODY (ROUTINE TESTING W REFLEX): HIV 1&2 Ab, 4th Generation: NONREACTIVE

## 2014-10-22 LAB — LDL CHOLESTEROL, DIRECT: Direct LDL: 166 mg/dL — ABNORMAL HIGH

## 2014-10-23 LAB — HEPATITIS PANEL, ACUTE
HCV AB: NEGATIVE
HEP B C IGM: NONREACTIVE
Hep A IgM: NONREACTIVE
Hepatitis B Surface Ag: NEGATIVE

## 2014-10-29 ENCOUNTER — Telehealth: Payer: Self-pay | Admitting: *Deleted

## 2014-10-29 NOTE — Telephone Encounter (Signed)
Pharmacy from [facility] called asking for Lori [name] if they could fill her clonazepam early. This is not due until 11/02/14 and today is only the 5th. The pharmacist explained that she Lori [name] said that she was going out of town but not until Friday and she would be able to pick up her medication then. I did not authorize an early fill of med.

## 2014-10-29 NOTE — Telephone Encounter (Signed)
Ok to fill Friday.

## 2014-10-29 NOTE — Telephone Encounter (Signed)
[facility] was made aware that refill would not be done until Friday.

## 2014-11-25 ENCOUNTER — Other Ambulatory Visit: Payer: Self-pay | Admitting: *Deleted

## 2014-11-25 MED ORDER — HYDROCODONE-IBUPROFEN 7.5-200 MG PO TABS: ORAL_TABLET | ORAL | Status: DC

## 2014-12-12 ENCOUNTER — Telehealth: Payer: Self-pay | Admitting: *Deleted

## 2014-12-12 NOTE — Telephone Encounter (Signed)
Pt called and lvm stating that she does not wish to have her medication refilled at this time. She is going to go off of her meds for awhile and would like to go to rehab to get some mental help and get her self together.   I informed Dr. Linford ArnoldMetheney and Alinda DoomsAngela T. of this since pt called earlier and requested med refill.Loralee PacasBarkley, Tanaia Hawkey [place]Lynetta

## 2014-12-19 ENCOUNTER — Ambulatory Visit: Payer: Self-pay | Admitting: Family Medicine

## 2015-01-06 ENCOUNTER — Ambulatory Visit (INDEPENDENT_AMBULATORY_CARE_PROVIDER_SITE_OTHER): Payer: 59 | Admitting: Physician Assistant

## 2015-01-06 ENCOUNTER — Encounter: Payer: Self-pay | Admitting: Physician Assistant

## 2015-01-06 VITALS — BP 144/82 | HR 94 | Wt 146.0 lb

## 2015-01-06 DIAGNOSIS — J0100 Acute maxillary sinusitis, unspecified: Secondary | ICD-10-CM

## 2015-01-06 MED ORDER — AZITHROMYCIN 250 MG PO TABS: ORAL_TABLET | ORAL | Status: DC

## 2015-01-06 NOTE — Progress Notes (Signed)
   Subjective:    Patient ID: Lori [name], female    DOB: [birth year], 54 y.o.   MRN: [id number]  HPI  Patient is a 54 year old female who presents to the clinic with sinus pressure, nasal congestion and slight ear pain. She has a history of nasal surgeries and recurrent sinusitis. She has tried Advil, aspirin and Mucinex. None seem to be helping. She is coughing up yellow to green sputum. She denies any wheezing or chest tightness. She has a ton of head pressure. Symptoms have been going on for the last 5 days.    Review of Systems  All other systems reviewed and are negative.      Objective:   Physical Exam  Constitutional: She is oriented to person, place, and time. She appears well-developed and well-nourished.  HENT:  Head: Normocephalic and atraumatic.  Right Ear: External ear normal.  Left Ear: External ear normal.  Nose: Nose normal.  Mouth/Throat: Oropharynx is clear and moist. No oropharyngeal exudate.  TMs clear bilaterally.  Bilateral sinus tenderness to palpation over maxillary sinuses.  Eyes: Conjunctivae are normal. Right eye exhibits no discharge. Left eye exhibits no discharge.  Neck: Normal range of motion. Neck supple.  Cardiovascular: Normal rate, regular rhythm and normal heart sounds.   Pulmonary/Chest: Effort normal and breath sounds normal.  Lymphadenopathy:    She has no cervical adenopathy.  Neurological: She is alert and oriented to person, place, and time.  Skin: Skin is dry.  Psychiatric: She has a normal mood and affect. Her behavior is normal.          Assessment & Plan:  Acute maxillary sinusitis- treated with zpak today. Encouraged sinus rinses. HO given. Follow up if not improving.

## 2015-01-06 NOTE — Patient Instructions (Signed)

## 2015-01-16 ENCOUNTER — Encounter: Payer: Self-pay | Admitting: Family Medicine

## 2015-01-16 ENCOUNTER — Ambulatory Visit (INDEPENDENT_AMBULATORY_CARE_PROVIDER_SITE_OTHER): Payer: 59 | Admitting: Family Medicine

## 2015-01-16 VITALS — BP 136/83 | HR 106 | Ht 67.0 in | Wt 146.0 lb

## 2015-01-16 DIAGNOSIS — F418 Other specified anxiety disorders: Secondary | ICD-10-CM

## 2015-01-16 DIAGNOSIS — M797 Fibromyalgia: Secondary | ICD-10-CM | POA: Diagnosis not present

## 2015-01-16 MED ORDER — CLONAZEPAM 0.5 MG PO TABS: ORAL_TABLET | ORAL | Status: DC

## 2015-01-16 MED ORDER — TRAMADOL HCL 50 MG PO TABS: 50.0000 mg | ORAL_TABLET | Freq: Two times a day (BID) | ORAL | Status: DC | PRN
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
## 2015-02-13 ENCOUNTER — Ambulatory Visit (INDEPENDENT_AMBULATORY_CARE_PROVIDER_SITE_OTHER): Payer: 59 | Admitting: Family Medicine

## 2015-02-13 ENCOUNTER — Encounter: Payer: Self-pay | Admitting: Family Medicine

## 2015-02-13 VITALS — BP 122/71 | HR 89 | Ht 67.0 in | Wt 146.0 lb

## 2015-02-13 DIAGNOSIS — M797 Fibromyalgia: Secondary | ICD-10-CM

## 2015-02-13 DIAGNOSIS — F32A Depression, unspecified: Secondary | ICD-10-CM

## 2015-02-13 DIAGNOSIS — F329 Major depressive disorder, single episode, unspecified: Secondary | ICD-10-CM | POA: Diagnosis not present

## 2015-02-13 MED ORDER — TRAMADOL HCL 50 MG PO TABS: 50.0000 mg | ORAL_TABLET | Freq: Two times a day (BID) | ORAL | Status: DC | PRN

## 2015-02-13 MED ORDER — CLONAZEPAM 0.5 MG PO TABS: ORAL_TABLET | ORAL | Status: DC

## 2015-02-13 NOTE — Progress Notes (Addendum)
   Subjective:    Patient ID: Lori [name], female    DOB: [birth year], 54 y.o.   MRN: [id number]  HPI Fibromyalgia - has been going to [employer] and has been trying to exercise some. Has been doing stretching walking, light weighs.  Has been tyrin got get out in the sun more. Has cut out soda.  Has ben using her tramadol BID.  She does complain of feeling down several days of the week but no loss of interest in things. She also complains of feeling nervous more than half the days as well as having some difficulty relaxing several days of the week. She denies any irritability symptoms.  Depression and anxiety - She is doing ok. She has but back on her klonopin to half tab BID which is fantastic.  She says she is really trying to cut but on its use and work on more natural remedies.    Review of Systems     Objective:   Physical Exam  Constitutional: She is oriented to person, place, and time. She appears well-developed and well-nourished.  HENT:  Head: Normocephalic and atraumatic.  Cardiovascular: Normal rate, regular rhythm and normal heart sounds.   Pulmonary/Chest: Effort normal and breath sounds normal.  Neurological: She is alert and oriented to person, place, and time.  Skin: Skin is warm and dry.  Psychiatric: She has a normal mood and affect. Her behavior is normal.          Assessment & Plan:  Depression/anxiety-gad 7 score of 5. And PHQ 9 score of 4. Previous PHQ 9 score was 7. I really improve her for cutting back on the clonazepam. Her score seem to have improved and she seems to be doing a little bit better today. Continue current regimen and see her back in about 3 months.  Fibromyalgia-she's made some good lifestyle changes which is fantastic. She's trying to get some exercises. Reminded her to make sure that she starts out low and builds up very gradually. If she's too aggressive too quickly and can really flare her fibromyalgia. Follow back up in about 3  months.

## 2015-03-06 ENCOUNTER — Other Ambulatory Visit: Payer: Self-pay | Admitting: Family Medicine

## 2015-03-17 ENCOUNTER — Other Ambulatory Visit: Payer: Self-pay | Admitting: *Deleted

## 2015-03-17 MED ORDER — TRAMADOL HCL 50 MG PO TABS: 50.0000 mg | ORAL_TABLET | Freq: Two times a day (BID) | ORAL | Status: DC | PRN

## 2015-03-17 NOTE — Telephone Encounter (Signed)
Pt called and asked that her Tramadol be faxed to [facility] in [place].Loralee PacasBarkley, Jarmon Javid [place]

## 2015-04-15 ENCOUNTER — Ambulatory Visit (INDEPENDENT_AMBULATORY_CARE_PROVIDER_SITE_OTHER): Payer: 59 | Admitting: Family Medicine

## 2015-04-15 ENCOUNTER — Encounter: Payer: Self-pay | Admitting: Family Medicine

## 2015-04-15 VITALS — BP 127/82 | HR 84 | Ht 67.0 in | Wt 147.0 lb

## 2015-04-15 DIAGNOSIS — F418 Other specified anxiety disorders: Secondary | ICD-10-CM | POA: Diagnosis not present

## 2015-04-15 DIAGNOSIS — M797 Fibromyalgia: Secondary | ICD-10-CM

## 2015-04-15 DIAGNOSIS — Z720 Tobacco use: Secondary | ICD-10-CM | POA: Diagnosis not present

## 2015-04-15 MED ORDER — CLONAZEPAM 0.5 MG PO TABS: ORAL_TABLET | ORAL | Status: DC

## 2015-04-15 MED ORDER — VARENICLINE TARTRATE 0.5 MG X 11 & 1 MG X 42 PO MISC: ORAL | Status: DC

## 2015-04-15 MED ORDER — TRAMADOL HCL 50 MG PO TABS: 50.0000 mg | ORAL_TABLET | Freq: Two times a day (BID) | ORAL | Status: DC | PRN

## 2015-04-15 NOTE — Addendum Note (Signed)
Addended by: Deno Etienne on: 04/15/2015 03:08 PM   Modules accepted: Level of Service

## 2015-04-15 NOTE — Progress Notes (Signed)
   Subjective:    Patient ID: Lori [name], female    DOB: October 14, 1961, 54 y.o.   MRN: [id number]  HPI F/U Mood - Only taking the clonazepam at bedtime. She has done a good job reducing the frequency of use. Overall she feels like she is in a good place with her mood. She feels like she's been getting better over the last couple months. She does complain of feeling a little nervous and anxious several days of the week and still feeling down several days of the week. She also has difficulty relaxing and difficulty controlling her worry. She denies any thoughts of wanting to harm herself and she rates her symptoms is somewhat difficult.  Fibromyalgia - she has been doing well. Has been walking for exercise and has been going to the gym occ.  Still has some good and some bad days.    She might have another nose surgery.  Encourage smoking cessation. She needs to have quit to have surgery. She has used Chantix in th epast and did well with it.  Has quit about 5-6 times. Smokes about 1/2 ppd.      Review of Systems     Objective:   Physical Exam  Constitutional: She is oriented to person, place, and time. She appears well-developed and well-nourished.  HENT:  Head: Normocephalic and atraumatic.  Cardiovascular: Normal rate, regular rhythm and normal heart sounds.   Pulmonary/Chest: Effort normal and breath sounds normal.  Neurological: She is alert and oriented to person, place, and time.  Skin: Skin is warm and dry.  Psychiatric: She has a normal mood and affect. Her behavior is normal.          Assessment & Plan:  Depression/anxiety-PHQ 9 score of 3 today, previous of 4 and Gad 7 score of 4 today, previous of 5. Patient is doing well and is therapeutically at goal. Continue limited use of clonazepam. Refill provided today. Follow-up in 2 months.  Fibromyalgia - she is really tried to make some major lifestyle changes and is time to get more regular exercise which is fantastic. Refilled  her tramadol which she uses most days twice a day for pain control. Follow-up in 2 months.  tob abuse - discussed options. She has used Chantix before and has been very successful with it and tolerated it well without any side effects. New prescription written today for the starter pack and coupon card provided.  Reminded due for screening mammogram.

## 2015-05-09 ENCOUNTER — Telehealth: Payer: Self-pay | Admitting: Family Medicine

## 2015-05-09 NOTE — Telephone Encounter (Signed)
Received fax from pharmacy for Chantix starting month pak sent through cover my meds and waiting on authorization. - CF

## 2015-05-13 NOTE — Telephone Encounter (Signed)
[facility] and they have approved Chantix starting Month Pak until 05/08/2016 or the coverage is no longer available. File ID [id number] - CF

## 2015-06-11 ENCOUNTER — Other Ambulatory Visit: Payer: Self-pay | Admitting: Family Medicine

## 2015-06-11 NOTE — Telephone Encounter (Signed)
Pt called. She's running out of Premarin.

## 2015-06-13 ENCOUNTER — Other Ambulatory Visit: Payer: Self-pay

## 2015-06-13 MED ORDER — PREMARIN 0.625 MG PO TABS: 0.6250 mg | ORAL_TABLET | Freq: Every day | ORAL | Status: DC

## 2015-07-01 ENCOUNTER — Other Ambulatory Visit: Payer: Self-pay

## 2015-07-01 MED ORDER — VARENICLINE TARTRATE 1 MG PO TABS: 1.0000 mg | ORAL_TABLET | Freq: Two times a day (BID) | ORAL | Status: DC

## 2015-07-17 ENCOUNTER — Encounter: Payer: Self-pay | Admitting: Family Medicine

## 2015-07-17 ENCOUNTER — Ambulatory Visit (INDEPENDENT_AMBULATORY_CARE_PROVIDER_SITE_OTHER): Payer: 59 | Admitting: Family Medicine

## 2015-07-17 VITALS — BP 133/76 | HR 72 | Temp 98.2°F | Ht 67.0 in | Wt 151.0 lb

## 2015-07-17 DIAGNOSIS — Z720 Tobacco use: Secondary | ICD-10-CM

## 2015-07-17 DIAGNOSIS — M797 Fibromyalgia: Secondary | ICD-10-CM

## 2015-07-17 DIAGNOSIS — F329 Major depressive disorder, single episode, unspecified: Secondary | ICD-10-CM | POA: Diagnosis not present

## 2015-07-17 DIAGNOSIS — F32A Depression, unspecified: Secondary | ICD-10-CM

## 2015-07-17 DIAGNOSIS — F17200 Nicotine dependence, unspecified, uncomplicated: Secondary | ICD-10-CM | POA: Insufficient documentation

## 2015-07-17 MED ORDER — CLONAZEPAM 0.5 MG PO TABS: ORAL_TABLET | ORAL | Status: DC

## 2015-07-17 MED ORDER — PREMARIN 0.625 MG PO TABS: 0.6250 mg | ORAL_TABLET | Freq: Every day | ORAL | Status: DC

## 2015-07-17 MED ORDER — TRAMADOL HCL 50 MG PO TABS: 50.0000 mg | ORAL_TABLET | Freq: Two times a day (BID) | ORAL | Status: DC | PRN

## 2015-07-17 NOTE — Progress Notes (Addendum)
   Subjective:    Patient ID: Lori [name], female    DOB: 1960/12/10, 54 y.o.   MRN: [id number]  HPI  follow-up depression/anxiety- when I saw her couple months ago she was doing well. She's been feeling better over the last couple of months. She was  Using clonazepam mostly at bedtime for sleep.  Fibromyalgia -  She uses tramadol for pain as needed. Though she really is taking it BID.  She cut out soda last spring. Having a hard time exercising.  Says hard to get motivated to go.  Using Tylenol as well  tob abuse - she is on the Chantix.  Has quit about 4-5 time before.    Review of Systems     Objective:   Physical Exam  Constitutional: She is oriented to person, place, and time. She appears well-developed and well-nourished.  HENT:  Head: Normocephalic and atraumatic.  Cardiovascular: Normal rate, regular rhythm and normal heart sounds.   Pulmonary/Chest: Effort normal and breath sounds normal.  Neurological: She is alert and oriented to person, place, and time.  Skin: Skin is warm and dry.  Psychiatric: She has a normal mood and affect. Her behavior is normal.          Assessment & Plan:   depression/anxiety- well controlled.  Continue current regimen. She's not really on a controller and just using the Klonopin mostly for sleep. Though occasionally will take an extra half a tab. Says 60 tabs should really last her at least 45 days. She is continuing to work on more natural remedies. PHQ 9 score of 0 today, previous was 3. Gad 7 score of 0 today, previous was 4. She rates her symptoms as not difficult at all.   fibromyalgia- having some difficulty exercising.   We discussed slowly working up to exercise. Recommend that she start with some stretches and maybe even some introductory yoga and then work up to doing some small amount of cardio such as 10 or 15 minutes. If she increases her exercise too quickly then it can sometimes cause more pain with fibromyalgia.  tob abuse -  strongly encouraged her to work on quitting. Very important especially such she's planning on having another rhinoplasty done.   reminded her she's due for mammogram- encouraged her to schedule it this fall.   Flu vaccine declined today.

## 2015-07-18 ENCOUNTER — Other Ambulatory Visit: Payer: Self-pay | Admitting: Family Medicine

## 2015-08-19 ENCOUNTER — Other Ambulatory Visit: Payer: Self-pay | Admitting: Family Medicine

## 2015-09-15 ENCOUNTER — Ambulatory Visit: Payer: 59 | Admitting: Family Medicine

## 2015-09-17 ENCOUNTER — Other Ambulatory Visit: Payer: Self-pay | Admitting: Family Medicine

## 2015-09-24 ENCOUNTER — Other Ambulatory Visit: Payer: Self-pay | Admitting: Family Medicine

## 2015-10-13 ENCOUNTER — Encounter: Payer: Self-pay | Admitting: Family Medicine

## 2015-10-13 ENCOUNTER — Ambulatory Visit (INDEPENDENT_AMBULATORY_CARE_PROVIDER_SITE_OTHER): Payer: 59 | Admitting: Family Medicine

## 2015-10-13 VITALS — BP 100/87 | HR 83 | Temp 97.8°F | Wt 150.0 lb

## 2015-10-13 DIAGNOSIS — M797 Fibromyalgia: Secondary | ICD-10-CM

## 2015-10-13 DIAGNOSIS — F329 Major depressive disorder, single episode, unspecified: Secondary | ICD-10-CM | POA: Diagnosis not present

## 2015-10-13 DIAGNOSIS — Z720 Tobacco use: Secondary | ICD-10-CM

## 2015-10-13 DIAGNOSIS — Z13220 Encounter for screening for lipoid disorders: Secondary | ICD-10-CM

## 2015-10-13 DIAGNOSIS — F32A Depression, unspecified: Secondary | ICD-10-CM

## 2015-10-13 DIAGNOSIS — G4700 Insomnia, unspecified: Secondary | ICD-10-CM

## 2015-10-13 DIAGNOSIS — R1084 Generalized abdominal pain: Secondary | ICD-10-CM | POA: Diagnosis not present

## 2015-10-13 MED ORDER — PANTOPRAZOLE SODIUM 40 MG PO TBEC: 40.0000 mg | DELAYED_RELEASE_TABLET | Freq: Every day | ORAL | Status: DC

## 2015-10-13 MED ORDER — PREMARIN 0.625 MG PO TABS: 0.6250 mg | ORAL_TABLET | Freq: Every day | ORAL | Status: DC

## 2015-10-13 MED ORDER — VARENICLINE TARTRATE 0.5 MG X 11 & 1 MG X 42 PO MISC: ORAL | Status: DC

## 2015-10-13 MED ORDER — TRAMADOL HCL 50 MG PO TABS: ORAL_TABLET | ORAL | Status: DC

## 2015-10-13 MED ORDER — CLONAZEPAM 0.5 MG PO TABS: ORAL_TABLET | ORAL | Status: DC

## 2015-10-13 MED ORDER — HYDROCODONE-ACETAMINOPHEN 5-325 MG PO TABS: 1.0000 | ORAL_TABLET | Freq: Four times a day (QID) | ORAL | Status: DC | PRN

## 2015-10-13 MED ORDER — DULOXETINE HCL 30 MG PO CPEP
30.0000 mg | ORAL_CAPSULE | Freq: Every day | ORAL | Status: DC
Start: 2015-10-13 — End: 2016-03-18

## 2015-10-13 NOTE — Progress Notes (Signed)
   Subjective:    Patient ID: Lori [name], female    DOB: 06/19/1961, 54 y.o.   MRN: [id number]  HPI She is generalized stomach pain about 2 weeks ago.  She has been taking 600 mg IBU QID for about 4 weeks for her fibromyalgia. She took it yrs ago for her fibromyalgia and has stomach issues then. Says her Tylenol quit working so she switched.  Also using trmadol BID.  No change in bowels.  No blood in stool.    Fibromyalgia - says the ibuprofen has been working better than tylenol.  Was is taking they tramadol BID as well. She has been trying to exercise and work out. She is really not on a controller medication.    Review of Systems     Objective:   Physical Exam  Constitutional: She is oriented to person, place, and time. She appears well-developed and well-nourished.  HENT:  Head: Normocephalic and atraumatic.  Cardiovascular: Normal rate, regular rhythm and normal heart sounds.   Pulmonary/Chest: Effort normal and breath sounds normal.  Abdominal: Soft. Bowel sounds are normal. She exhibits no distension and no mass. There is tenderness. There is no rebound and no guarding.  General tenderness. NO specific areas.    Neurological: She is alert and oriented to person, place, and time.  Skin: Skin is warm and dry.  Psychiatric: She has a normal mood and affect. Her behavior is normal.        Assessment & Plan:  Abdominal pain - likely secondary to overuse of NSIAD. Will check renal function as well. Stop NSAIDs. Start PPI.  Ok to restart NSAID in 1-2 weeks. Will increase tramadol to TID for short term  Also given small quantity fo hydrocodone for short term relief in place of NSAIDs for control of fibro pain  Fibromyalgia - short term increase tramadol TID and small quantity of hydrocodone.  Discussed trying a controller. Will retry Cymblata. Had tried it years ago but can't remember what side effects she had if any. Thinks it just may not have been effective.  She is willing to  retry it. Discussed expectations.    Tobacco abuse - she wants to quit again at the beginning of the year.  Will reorder Chantix.    INsomnia- refilled clonazepam for 45 tabs. Most night takes 1 tab and occ takes extra half.

## 2015-10-15 ENCOUNTER — Telehealth: Payer: Self-pay

## 2015-10-15 NOTE — Telephone Encounter (Signed)
Prior authorization for chantix starter pack done via cover my meds.  Waiting response.

## 2015-10-21 ENCOUNTER — Telehealth: Payer: Self-pay | Admitting: Family Medicine

## 2015-10-21 NOTE — Telephone Encounter (Signed)
Received fax from [facility] and Chantix is approved from 10/20/2015 - 05/08/2016. File ID [id number]. - CF

## 2015-10-24 ENCOUNTER — Other Ambulatory Visit: Payer: Self-pay | Admitting: Family Medicine

## 2015-10-24 LAB — CBC WITH DIFFERENTIAL/PLATELET
BASOS PCT: 1 % (ref 0–1)
Basophils Absolute: 0.1 10*3/uL (ref 0.0–0.1)
Eosinophils Absolute: 0.2 10*3/uL (ref 0.0–0.7)
Eosinophils Relative: 2 % (ref 0–5)
HCT: 38.2 % (ref 36.0–46.0)
HEMOGLOBIN: 13.3 g/dL (ref 12.0–15.0)
Lymphocytes Relative: 40 % (ref 12–46)
Lymphs Abs: 3.3 10*3/uL (ref 0.7–4.0)
MCH: 28.3 pg (ref 26.0–34.0)
MCHC: 34.8 g/dL (ref 30.0–36.0)
MCV: 81.3 fL (ref 78.0–100.0)
MONOS PCT: 5 % (ref 3–12)
MPV: 10.7 fL (ref 8.6–12.4)
Monocytes Absolute: 0.4 10*3/uL (ref 0.1–1.0)
NEUTROS ABS: 4.3 10*3/uL (ref 1.7–7.7)
NEUTROS PCT: 52 % (ref 43–77)
Platelets: 276 10*3/uL (ref 150–400)
RBC: 4.7 MIL/uL (ref 3.87–5.11)
RDW: 12.7 % (ref 11.5–15.5)
WBC: 8.2 10*3/uL (ref 4.0–10.5)

## 2015-10-25 LAB — COMPLETE METABOLIC PANEL WITH GFR
ALBUMIN: 4.3 g/dL (ref 3.6–5.1)
ALK PHOS: 58 U/L (ref 33–130)
ALT: 13 U/L (ref 6–29)
AST: 14 U/L (ref 10–35)
BILIRUBIN TOTAL: 0.5 mg/dL (ref 0.2–1.2)
BUN: 8 mg/dL (ref 7–25)
CALCIUM: 9.4 mg/dL (ref 8.6–10.4)
CO2: 25 mmol/L (ref 20–31)
CREATININE: 0.56 mg/dL (ref 0.50–1.05)
Chloride: 103 mmol/L (ref 98–110)
GFR, Est Non African American: >89 mL/min (ref >=60)
Glucose, Bld: 80 mg/dL (ref 65–99)
Potassium: 3.7 mmol/L (ref 3.5–5.3)
Sodium: 137 mmol/L (ref 135–146)
TOTAL PROTEIN: 6.9 g/dL (ref 6.1–8.1)

## 2015-10-25 LAB — LIPID PANEL
Cholesterol: 279 mg/dL — ABNORMAL HIGH (ref 125–200)
HDL: 42 mg/dL — ABNORMAL LOW (ref >=46)
TRIGLYCERIDES: 453 mg/dL — AB (ref <150)
Total CHOL/HDL Ratio: 6.6 Ratio — ABNORMAL HIGH (ref <=5.0)

## 2015-10-28 ENCOUNTER — Other Ambulatory Visit: Payer: Self-pay | Admitting: Family Medicine

## 2015-10-29 ENCOUNTER — Other Ambulatory Visit: Payer: Self-pay | Admitting: *Deleted

## 2015-10-29 LAB — LDL CHOLESTEROL, DIRECT: LDL DIRECT: 172 mg/dL — AB (ref <130)

## 2015-10-29 MED ORDER — OMEGA-3-ACID ETHYL ESTERS 1 G PO CAPS: 2.0000 g | ORAL_CAPSULE | Freq: Two times a day (BID) | ORAL | Status: DC

## 2015-10-29 NOTE — Addendum Note (Signed)
Addended by: Nani GasserMETHENEY, CATHERINE D on: 10/29/2015 08:24 AM   Modules accepted: Orders

## 2015-11-12 ENCOUNTER — Other Ambulatory Visit: Payer: Self-pay | Admitting: Family Medicine

## 2015-11-12 NOTE — Telephone Encounter (Signed)
Historical provider.  

## 2015-11-18 ENCOUNTER — Other Ambulatory Visit: Payer: Self-pay | Admitting: Family Medicine

## 2015-11-28 ENCOUNTER — Encounter: Payer: Self-pay | Admitting: Physician Assistant

## 2015-11-28 ENCOUNTER — Ambulatory Visit (INDEPENDENT_AMBULATORY_CARE_PROVIDER_SITE_OTHER): Payer: BLUE CROSS/BLUE SHIELD | Admitting: Physician Assistant

## 2015-11-28 VITALS — BP 167/78 | HR 83 | Ht 67.0 in | Wt 148.0 lb

## 2015-11-28 DIAGNOSIS — G43A0 Cyclical vomiting, not intractable: Secondary | ICD-10-CM | POA: Diagnosis not present

## 2015-11-28 DIAGNOSIS — J0110 Acute frontal sinusitis, unspecified: Secondary | ICD-10-CM | POA: Diagnosis not present

## 2015-11-28 DIAGNOSIS — G43001 Migraine without aura, not intractable, with status migrainosus: Secondary | ICD-10-CM | POA: Diagnosis not present

## 2015-11-28 DIAGNOSIS — R1115 Cyclical vomiting syndrome unrelated to migraine: Secondary | ICD-10-CM | POA: Insufficient documentation

## 2015-11-28 MED ORDER — PROMETHAZINE HCL 25 MG/ML IJ SOLN
25.0000 mg | Freq: Once | INTRAMUSCULAR | Status: AC
  Administered 2015-11-28: 25 mg via INTRAMUSCULAR

## 2015-11-28 MED ORDER — KETOROLAC TROMETHAMINE 60 MG/2ML IM SOLN
60.0000 mg | Freq: Once | INTRAMUSCULAR | Status: AC
  Administered 2015-11-28: 60 mg via INTRAMUSCULAR

## 2015-11-28 MED ORDER — AMOXICILLIN-POT CLAVULANATE 875-125 MG PO TABS: 1.0000 | ORAL_TABLET | Freq: Two times a day (BID) | ORAL | Status: DC

## 2015-11-28 MED ORDER — ONDANSETRON 8 MG PO TBDP: 8.0000 mg | ORAL_TABLET | Freq: Three times a day (TID) | ORAL | Status: DC | PRN

## 2015-11-28 NOTE — Progress Notes (Signed)
   Subjective:    Patient ID: Lori [name], female    DOB: [birth year], 55 y.o.   MRN: [id number]  HPI Patient is a 55 year old female presenting with a disabling 9/10 frontal headache that radiates into the face with associated nausea, vomiting, photophobia, and phonophobia. Patient has missed work for the past three days due to headache. Patient describes headache as constant in nature, sharp and dull. Patient has a past medical history relevant for chronic migraines, fibromyalgia, and arthritis. Patient states that she has not had a migraine for the past three years. Patient currently smokes  pack of cigarettes per day. Patient denies prolonged periods of immobility, contraceptive use, impairments in ambulation, aphasia, and apraxia. Patient has taken Excedrin and Tramadol with no relief. Patient states that she has experienced congestion, runny nose, and sore throat for the past week. Patient has been unable to tolerate foods/liquids by mouth due to nausea. Patient has been afebrile.    Review of Systems  All other systems reviewed and are negative.      Objective:   Physical Exam  Constitutional: She is oriented to person, place, and time.  Weak and pale.   HENT:  Head: Normocephalic and atraumatic.  Right Ear: External ear normal.  Left Ear: External ear normal.  Nose: Nose normal.  Mouth/Throat: Oropharynx is clear and moist. No oropharyngeal exudate.  TM's clear.  Tenderness over bilateral maxillary and frontal to palpation.  Bilateral turbinates red and swollen.   Eyes: Conjunctivae and EOM are normal. Pupils are equal, round, and reactive to light. Right eye exhibits no discharge. Left eye exhibits no discharge.  Neck: Normal range of motion. Neck supple. No thyromegaly present.  Cardiovascular: Normal rate, regular rhythm and normal heart sounds.   Pulmonary/Chest: Effort normal and breath sounds normal. She has no wheezes.  Abdominal: Soft. Bowel sounds are normal. She  exhibits no distension and no mass. There is no tenderness. There is no rebound and no guarding.  Lymphadenopathy:    She has no cervical adenopathy.  Neurological: She is alert and oriented to person, place, and time.  Skin: Skin is dry.  Psychiatric: She has a normal mood and affect. Her behavior is normal.          Assessment & Plan:  Migraine without aura/sinusitis- no neurological findings on exam. toradol  IM with phenergan  IM. i suspected sinusitis induced HA. augmentin for 10 days. Rest and hydrate. zofran given as needed for nausea. Follow up as needed.

## 2015-12-09 ENCOUNTER — Other Ambulatory Visit: Payer: Self-pay | Admitting: Family Medicine

## 2015-12-15 ENCOUNTER — Ambulatory Visit (INDEPENDENT_AMBULATORY_CARE_PROVIDER_SITE_OTHER): Payer: BLUE CROSS/BLUE SHIELD | Admitting: Family Medicine

## 2015-12-15 ENCOUNTER — Encounter: Payer: Self-pay | Admitting: Family Medicine

## 2015-12-15 VITALS — BP 120/70 | HR 90 | Wt 151.0 lb

## 2015-12-15 DIAGNOSIS — M1711 Unilateral primary osteoarthritis, right knee: Secondary | ICD-10-CM | POA: Diagnosis not present

## 2015-12-15 DIAGNOSIS — Z79890 Hormone replacement therapy (postmenopausal): Secondary | ICD-10-CM | POA: Diagnosis not present

## 2015-12-15 DIAGNOSIS — M797 Fibromyalgia: Secondary | ICD-10-CM

## 2015-12-15 MED ORDER — PREMARIN 0.625 MG PO TABS: 0.6250 mg | ORAL_TABLET | Freq: Every day | ORAL | Status: DC

## 2015-12-15 MED ORDER — CLONAZEPAM 0.5 MG PO TABS: 0.5000 mg | ORAL_TABLET | Freq: Two times a day (BID) | ORAL | Status: DC | PRN

## 2015-12-15 MED ORDER — DULOXETINE HCL 30 MG PO CPEP: 30.0000 mg | ORAL_CAPSULE | Freq: Every day | ORAL | Status: DC

## 2015-12-15 MED ORDER — DICLOFENAC SODIUM 2 % TD SOLN: 1.0000 "application " | Freq: Three times a day (TID) | TRANSDERMAL | Status: DC | PRN

## 2015-12-15 NOTE — Progress Notes (Signed)
   Subjective:    Patient ID: Lori [name], female    DOB: 10-30-1960, 55 y.o.   MRN: [id number]  HPI Follow-up fibromyalgia-when I saw her in December we decided to increase her tramadol for a short period of time and to give her small quantity of hydrocodone for recent flares. We discussed trying a controller such as Cymbalta. The Cymbalta evidently caused her to have more frequent headaches so she stopped it. She came in and was evaluated for an acute migraine and also diagnosed with a sinus infection. She does have recurrent sinus infections with a prior history of nasal surgery. Be willing to retry the Cymbalta as she was starting to get some significant rib pain relief on her second week of the medication.  Hormone replacement therapy-refilled her Premarin today. She is doing well with the medication.  Generalized anxiety disorder-requesting refill on her clonazepam.  Review of Systems     Objective:   Physical Exam  Constitutional: She is oriented to person, place, and time. She appears well-developed and well-nourished.  HENT:  Head: Normocephalic and atraumatic.  Cardiovascular: Normal rate, regular rhythm and normal heart sounds.   Pulmonary/Chest: Effort normal and breath sounds normal.  Neurological: She is alert and oriented to person, place, and time.  Skin: Skin is warm and dry.  Psychiatric: She has a normal mood and affect. Her behavior is normal.          Assessment & Plan:  Fibromyalgia-she has artery tried Lyrica and it caused nightmares. Now added Cymbalta helped her her intolerance list as a cause headaches.  She wants to retry it first. If causes HA again then consider trial of amitryptiline.  She should have a refill on the Cymbalta and plans to start with that again first. I'll see her back in 2-3 months.  HRT - refilled her Premarin. She has had a hysterectomy.  Osteo-Arthritis of right knee and right shoulder. Recommend a trial of a topical NSAID such as  NSAID. I think this could be very helpful for her. Prescription sent to the pharmacy.  remimded her to get her mammogram as well.

## 2016-01-02 ENCOUNTER — Other Ambulatory Visit: Payer: Self-pay | Admitting: Family Medicine

## 2016-01-05 ENCOUNTER — Other Ambulatory Visit: Payer: Self-pay | Admitting: *Deleted

## 2016-01-12 ENCOUNTER — Other Ambulatory Visit: Payer: Self-pay | Admitting: Family Medicine

## 2016-01-15 ENCOUNTER — Ambulatory Visit: Payer: BLUE CROSS/BLUE SHIELD | Admitting: Family Medicine

## 2016-01-22 ENCOUNTER — Encounter: Payer: Self-pay | Admitting: Family Medicine

## 2016-01-22 ENCOUNTER — Ambulatory Visit (INDEPENDENT_AMBULATORY_CARE_PROVIDER_SITE_OTHER): Payer: BLUE CROSS/BLUE SHIELD | Admitting: Family Medicine

## 2016-01-22 VITALS — BP 139/66 | HR 81 | Wt 150.0 lb

## 2016-01-22 DIAGNOSIS — Z720 Tobacco use: Secondary | ICD-10-CM

## 2016-01-22 DIAGNOSIS — M1711 Unilateral primary osteoarthritis, right knee: Secondary | ICD-10-CM

## 2016-01-22 DIAGNOSIS — M797 Fibromyalgia: Secondary | ICD-10-CM

## 2016-01-22 DIAGNOSIS — M159 Polyosteoarthritis, unspecified: Secondary | ICD-10-CM

## 2016-01-22 MED ORDER — HYDROCODONE-IBUPROFEN 5-200 MG PO TABS: 1.0000 | ORAL_TABLET | Freq: Three times a day (TID) | ORAL | Status: DC | PRN

## 2016-01-22 MED ORDER — VARENICLINE TARTRATE 1 MG PO TABS: 1.0000 mg | ORAL_TABLET | Freq: Two times a day (BID) | ORAL | Status: DC

## 2016-01-22 NOTE — Progress Notes (Signed)
   Subjective:    Patient ID: Lori [name], female    DOB: 1961/02/15, 55 y.o.   MRN: [id number]  HPI Follow-up fibromyalgia-she's been struggling a little bit more with pain and fatigue over the last month. She's also been going through a lot of personal belongings from her separation from her husband. She is hoping to move to a new place same. She did start the Cymbalta about a month ago. She does feel like that it works better to reduce her pain compared to the Zoloft but she feels a little overmedicated on it and says she felt better on the Zoloft in general. Next  Tobacco abuse-she started the Chantix and actually quit smoking 2 weeks ago. She feels like the Chantix is really help curb her craving for smoking but she is having some vivid dreams and some occasional headaches.  Osteoarthritis of knees and shoulders-she says the pen said really seems to help. She feels like that it causes a little bit of swelling of the joint but then provides relief and reduces cracking and popping. She is actually very happy with the Pensaid. Her low back occasionally.   Review of Systems     Objective:   Physical Exam  Constitutional: She is oriented to person, place, and time. She appears well-developed and well-nourished.  HENT:  Head: Normocephalic and atraumatic.  Cardiovascular: Normal rate, regular rhythm and normal heart sounds.   Pulmonary/Chest: Effort normal and breath sounds normal.  Neurological: She is alert and oriented to person, place, and time.  Skin: Skin is warm and dry.  Psychiatric: She has a normal mood and affect. Her behavior is normal.          Assessment & Plan:  fibromyalgia-encouraged her to continue the Cymbalta 30 mg for now. I want her to really give it 1 more month to see if she starts to feel better on the medication as it does seem to relieve more pain for her and the Zoloft was doing. That we can always go back to the Zoloft if needed. I did go ahead and refill  her Vicoprofen today. Her last narcotic refill was 3 months ago for 30 tabs. Encouraged her to use it sparingly. She feels like the Vicoprofen is more effective than the hydrocodone. Next  Tobacco abuse-okay to decrease Chantix to 0.5 mg twice a day. Will see if this might reduce the frequency of headaches and drained  Osteoarthritis knees-continue with NSAID as needed.

## 2016-01-22 NOTE — Patient Instructions (Signed)
Okay to cut the Chantix in half and take half a tab twice a day.

## 2016-02-03 ENCOUNTER — Telehealth: Payer: Self-pay

## 2016-02-03 ENCOUNTER — Other Ambulatory Visit: Payer: Self-pay | Admitting: Family Medicine

## 2016-02-03 NOTE — Telephone Encounter (Signed)
Pt called said that the pharmacy wasn't able to fill the medication for Ultram.  Spoke with the pharmacy and they said there was a software issue, so i gave him the script information over the phone.

## 2016-02-09 ENCOUNTER — Other Ambulatory Visit: Payer: Self-pay | Admitting: Family Medicine

## 2016-02-10 ENCOUNTER — Other Ambulatory Visit: Payer: Self-pay | Admitting: Family Medicine

## 2016-03-08 ENCOUNTER — Encounter: Payer: Self-pay | Admitting: Family Medicine

## 2016-03-08 ENCOUNTER — Ambulatory Visit (INDEPENDENT_AMBULATORY_CARE_PROVIDER_SITE_OTHER): Payer: BLUE CROSS/BLUE SHIELD | Admitting: Family Medicine

## 2016-03-08 VITALS — BP 141/87 | HR 83 | Wt 152.0 lb

## 2016-03-08 DIAGNOSIS — M797 Fibromyalgia: Secondary | ICD-10-CM

## 2016-03-08 DIAGNOSIS — Z720 Tobacco use: Secondary | ICD-10-CM | POA: Diagnosis not present

## 2016-03-08 DIAGNOSIS — M159 Polyosteoarthritis, unspecified: Secondary | ICD-10-CM

## 2016-03-08 DIAGNOSIS — F329 Major depressive disorder, single episode, unspecified: Secondary | ICD-10-CM

## 2016-03-08 DIAGNOSIS — R51 Headache: Secondary | ICD-10-CM

## 2016-03-08 DIAGNOSIS — R519 Headache, unspecified: Secondary | ICD-10-CM

## 2016-03-08 DIAGNOSIS — F32A Depression, unspecified: Secondary | ICD-10-CM

## 2016-03-08 MED ORDER — SERTRALINE HCL 50 MG PO TABS: ORAL_TABLET | ORAL | Status: DC

## 2016-03-08 MED ORDER — CLONAZEPAM 0.5 MG PO TABS: 0.5000 mg | ORAL_TABLET | Freq: Two times a day (BID) | ORAL | Status: DC | PRN

## 2016-03-08 MED ORDER — DULOXETINE HCL 20 MG PO CPEP: 20.0000 mg | ORAL_CAPSULE | Freq: Every day | ORAL | Status: DC

## 2016-03-08 MED ORDER — HYDROCODONE-IBUPROFEN 5-200 MG PO TABS: 1.0000 | ORAL_TABLET | Freq: Three times a day (TID) | ORAL | Status: DC | PRN

## 2016-03-08 NOTE — Patient Instructions (Signed)
Will send over new prescription for 20 mg Cymbalta. Start 20 mg daily for 4 days and then take every other day for a week. Then stop and okay to start the Zoloft.
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
## 2016-03-18 ENCOUNTER — Other Ambulatory Visit: Payer: Self-pay | Admitting: Family Medicine

## 2016-03-29 ENCOUNTER — Other Ambulatory Visit: Payer: Self-pay | Admitting: Family Medicine

## 2016-04-07 ENCOUNTER — Other Ambulatory Visit: Payer: Self-pay

## 2016-04-07 ENCOUNTER — Telehealth: Payer: Self-pay

## 2016-04-07 MED ORDER — CLONAZEPAM 0.5 MG PO TABS: 0.5000 mg | ORAL_TABLET | Freq: Two times a day (BID) | ORAL | Status: DC | PRN

## 2016-04-07 NOTE — Telephone Encounter (Signed)
Okay to fill with one refill. She really needs to call the pharmacy for all future refills and not the office.

## 2016-04-20 ENCOUNTER — Other Ambulatory Visit: Payer: Self-pay | Admitting: Family Medicine

## 2016-04-26 ENCOUNTER — Other Ambulatory Visit: Payer: Self-pay | Admitting: Family Medicine

## 2016-05-03 ENCOUNTER — Encounter: Payer: Self-pay | Admitting: Family Medicine

## 2016-05-03 ENCOUNTER — Ambulatory Visit (INDEPENDENT_AMBULATORY_CARE_PROVIDER_SITE_OTHER): Payer: BLUE CROSS/BLUE SHIELD | Admitting: Family Medicine

## 2016-05-03 VITALS — BP 142/72 | HR 69 | Wt 154.0 lb

## 2016-05-03 DIAGNOSIS — M797 Fibromyalgia: Secondary | ICD-10-CM | POA: Diagnosis not present

## 2016-05-03 DIAGNOSIS — N951 Menopausal and female climacteric states: Secondary | ICD-10-CM | POA: Diagnosis not present

## 2016-05-03 DIAGNOSIS — Z87891 Personal history of nicotine dependence: Secondary | ICD-10-CM

## 2016-05-03 DIAGNOSIS — F418 Other specified anxiety disorders: Secondary | ICD-10-CM

## 2016-05-03 DIAGNOSIS — R232 Flushing: Secondary | ICD-10-CM

## 2016-05-03 DIAGNOSIS — G8929 Other chronic pain: Secondary | ICD-10-CM

## 2016-05-03 DIAGNOSIS — Z7189 Other specified counseling: Secondary | ICD-10-CM

## 2016-05-03 DIAGNOSIS — M159 Polyosteoarthritis, unspecified: Secondary | ICD-10-CM | POA: Diagnosis not present

## 2016-05-03 MED ORDER — HYDROCODONE-IBUPROFEN 5-200 MG PO TABS: 1.0000 | ORAL_TABLET | Freq: Three times a day (TID) | ORAL | Status: DC | PRN

## 2016-05-03 MED ORDER — CLONAZEPAM 0.5 MG PO TABS: 0.5000 mg | ORAL_TABLET | Freq: Two times a day (BID) | ORAL | Status: DC | PRN

## 2016-05-03 NOTE — Progress Notes (Signed)
Subjective:    CC: fibromyalgia   HPI: Fibromyalgia-when I last saw her we tapered her off the Cymbalta and try switching her back to Zoloft. She felt like the Cymbalta was causing excessive sweating which she really did not like.She says she's glad to be back on the Zoloft. She actually feels like it works much better than the Cymbalta. She is happy with her current dose and regimen.  She was still having hot flashes so she stopped her lovaza. She has switched to OTC fish oil.  Hot flashes did improve but did not completely resolve.   She is still having hot flashes.    Tobacco abuse-she has now quit smoking with the assistance of Chantix.  Follow-up depression-overall she is really doing very well. She does report feeling nervous several days of the week but denies any signs of depression such as feeling down depressed or hopeless. Is currently on sertraline 50 mg daily.  Past medical history, Surgical history, Family history not pertinant except as noted below, Social history, Allergies, and medications have been entered into the medical record, reviewed, and corrections made.   Review of Systems: No fevers, chills, night sweats, weight loss, chest pain, or shortness of breath.   Objective:    General: Well Developed, well nourished, and in no acute distress.  Neuro: Alert and oriented x3, extra-ocular muscles intact, sensation grossly intact.  HEENT: Normocephalic, atraumatic  Skin: Warm and dry, no rashes. Cardiac: Regular rate and rhythm, no murmurs rubs or gallops, no lower extremity edema.  Respiratory: Clear to auscultation bilaterally. Not using accessory muscles, speaking in full sentences.   Impression and Recommendations:    Fibromyalgia - Doing well on sertraline. Continue current regimen. Follow-up in 3 months. Continue work on increasing activity level.  Hot flashes - will check tsh.  Could also consider restarting Lovaza. I discussed with her that this is not a  typical side effect with this medication. Certainly if she feels like it causes an exacerbation of her hot flashes and she can stop it and we can add it to her intolerance list. But I think it's worth retrying the medication.  Tobacco abuse-did well with the Chantix. Has gained a little bit of weight since stopping smoking but just encourage her to continue to work on portion control and healthy choices for diet.  Depression-Ganz 7 score of 2 today and PHQ 9 score of 1. Continue with current dose of sertraline 50 mg daily.  *Arthritis of multiple joints-did refill her tramadol and Vicoprofen today. She tries to use the Vicoprofen sparingly. We will go ahead and have her sign a pain contract today as well as her urine drug screen.

## 2016-05-04 LAB — BASIC METABOLIC PANEL
BUN: 9 mg/dL (ref 7–25)
CHLORIDE: 105 mmol/L (ref 98–110)
CO2: 21 mmol/L (ref 20–31)
CREATININE: 0.62 mg/dL (ref 0.50–1.05)
Calcium: 9.7 mg/dL (ref 8.6–10.4)
Glucose, Bld: 88 mg/dL (ref 65–99)
POTASSIUM: 4 mmol/L (ref 3.5–5.3)
Sodium: 140 mmol/L (ref 135–146)

## 2016-05-04 LAB — TSH: TSH: 1.77 m[IU]/L

## 2016-05-04 NOTE — Progress Notes (Signed)
Quick Note:  All labs are normal. ______ 

## 2016-05-07 LAB — PAIN MGMT, PROFILE 6 CONF W/O MM, U
6 ACETYLMORPHINE: NEGATIVE ng/mL (ref <10)
ALPHAHYDROXYTRIAZOLAM: NEGATIVE ng/mL (ref <50)
AMINOCLONAZEPAM: 69 ng/mL — AB (ref <25)
Alcohol Metabolites: NEGATIVE ng/mL (ref <500)
Alphahydroxyalprazolam: NEGATIVE ng/mL (ref <25)
Alphahydroxymidazolam: NEGATIVE ng/mL (ref <50)
Amphetamines: NEGATIVE ng/mL (ref <500)
BARBITURATES: NEGATIVE ng/mL (ref <300)
BENZODIAZEPINES: POSITIVE ng/mL — AB (ref <100)
CODEINE: NEGATIVE ng/mL (ref <50)
CREATININE: 85.7 mg/dL (ref >=20.0)
Cocaine Metabolite: NEGATIVE ng/mL (ref <150)
HYDROCODONE: NEGATIVE ng/mL (ref <50)
HYDROMORPHONE: NEGATIVE ng/mL (ref <50)
HYDROXYETHYLFLURAZEPAM: NEGATIVE ng/mL (ref <50)
LORAZEPAM: NEGATIVE ng/mL (ref <50)
MORPHINE: NEGATIVE ng/mL (ref <50)
Marijuana Metabolite: NEGATIVE ng/mL (ref <20)
Methadone Metabolite: NEGATIVE ng/mL (ref <100)
NORDIAZEPAM: NEGATIVE ng/mL (ref <50)
Norhydrocodone: NEGATIVE ng/mL (ref <50)
OPIATES: NEGATIVE ng/mL (ref <100)
OXIDANT: NEGATIVE ug/mL (ref <200)
Oxazepam: NEGATIVE ng/mL (ref <50)
Oxycodone: NEGATIVE ng/mL (ref <100)
PH: 6.5 (ref 4.5–9.0)
Phencyclidine: NEGATIVE ng/mL (ref <25)
Please note:: 0
Temazepam: NEGATIVE ng/mL (ref <50)

## 2016-05-17 ENCOUNTER — Other Ambulatory Visit: Payer: Self-pay | Admitting: Family Medicine

## 2016-05-19 ENCOUNTER — Other Ambulatory Visit: Payer: Self-pay | Admitting: Family Medicine

## 2016-06-01 ENCOUNTER — Other Ambulatory Visit: Payer: Self-pay | Admitting: *Deleted

## 2016-06-01 MED ORDER — CLONAZEPAM 0.5 MG PO TABS: 0.5000 mg | ORAL_TABLET | Freq: Two times a day (BID) | ORAL | 0 refills | Status: DC | PRN

## 2016-06-01 MED ORDER — HYDROCODONE-IBUPROFEN 5-200 MG PO TABS: 1.0000 | ORAL_TABLET | Freq: Three times a day (TID) | ORAL | 0 refills | Status: DC | PRN

## 2016-06-21 ENCOUNTER — Other Ambulatory Visit: Payer: Self-pay | Admitting: Family Medicine

## 2016-06-30 ENCOUNTER — Other Ambulatory Visit: Payer: Self-pay | Admitting: *Deleted

## 2016-06-30 ENCOUNTER — Other Ambulatory Visit: Payer: Self-pay | Admitting: Family Medicine

## 2016-06-30 MED ORDER — HYDROCODONE-IBUPROFEN 5-200 MG PO TABS: 1.0000 | ORAL_TABLET | Freq: Three times a day (TID) | ORAL | 0 refills | Status: DC | PRN

## 2016-07-01 ENCOUNTER — Other Ambulatory Visit: Payer: Self-pay | Admitting: Family Medicine

## 2016-07-01 MED ORDER — HYDROCODONE-IBUPROFEN 7.5-200 MG PO TABS: ORAL_TABLET | ORAL | 0 refills | Status: DC

## 2016-07-20 ENCOUNTER — Ambulatory Visit: Payer: BLUE CROSS/BLUE SHIELD | Admitting: Family Medicine

## 2016-07-21 ENCOUNTER — Other Ambulatory Visit: Payer: Self-pay | Admitting: *Deleted

## 2016-07-21 ENCOUNTER — Other Ambulatory Visit: Payer: Self-pay

## 2016-07-21 MED ORDER — TRAMADOL HCL 50 MG PO TABS: 50.0000 mg | ORAL_TABLET | Freq: Three times a day (TID) | ORAL | 0 refills | Status: DC | PRN

## 2016-07-28 ENCOUNTER — Other Ambulatory Visit: Payer: Self-pay

## 2016-07-28 MED ORDER — CLONAZEPAM 0.5 MG PO TABS: 0.5000 mg | ORAL_TABLET | Freq: Two times a day (BID) | ORAL | 0 refills | Status: DC | PRN

## 2016-07-28 MED ORDER — HYDROCODONE-IBUPROFEN 7.5-200 MG PO TABS: ORAL_TABLET | ORAL | 0 refills | Status: DC

## 2016-08-03 ENCOUNTER — Ambulatory Visit: Payer: BLUE CROSS/BLUE SHIELD | Admitting: Family Medicine

## 2016-08-09 ENCOUNTER — Ambulatory Visit: Payer: BLUE CROSS/BLUE SHIELD | Admitting: Family Medicine

## 2016-08-12 ENCOUNTER — Ambulatory Visit: Payer: BLUE CROSS/BLUE SHIELD | Admitting: Family Medicine

## 2016-08-13 ENCOUNTER — Ambulatory Visit: Payer: BLUE CROSS/BLUE SHIELD | Admitting: Family Medicine

## 2016-08-18 ENCOUNTER — Other Ambulatory Visit: Payer: Self-pay | Admitting: Family Medicine

## 2016-08-19 ENCOUNTER — Encounter: Payer: Self-pay | Admitting: Family Medicine

## 2016-08-19 ENCOUNTER — Ambulatory Visit (INDEPENDENT_AMBULATORY_CARE_PROVIDER_SITE_OTHER): Payer: BLUE CROSS/BLUE SHIELD | Admitting: Family Medicine

## 2016-08-19 VITALS — BP 132/86 | HR 96 | Ht 67.0 in | Wt 153.0 lb

## 2016-08-19 DIAGNOSIS — M797 Fibromyalgia: Secondary | ICD-10-CM | POA: Diagnosis not present

## 2016-08-19 DIAGNOSIS — M2550 Pain in unspecified joint: Secondary | ICD-10-CM | POA: Diagnosis not present

## 2016-08-19 DIAGNOSIS — I10 Essential (primary) hypertension: Secondary | ICD-10-CM | POA: Insufficient documentation

## 2016-08-19 MED ORDER — LISINOPRIL 10 MG PO TABS: 10.0000 mg | ORAL_TABLET | Freq: Every day | ORAL | 3 refills | Status: DC

## 2016-08-19 MED ORDER — DICLOFENAC SODIUM 2 % TD SOLN: 1.0000 "application " | Freq: Three times a day (TID) | TRANSDERMAL | 99 refills | Status: DC | PRN

## 2016-08-19 MED ORDER — SERTRALINE HCL 50 MG PO TABS: 100.0000 mg | ORAL_TABLET | Freq: Every day | ORAL | 3 refills | Status: DC

## 2016-08-19 MED ORDER — TRAMADOL HCL 50 MG PO TABS: 50.0000 mg | ORAL_TABLET | Freq: Three times a day (TID) | ORAL | 0 refills | Status: DC | PRN

## 2016-08-19 MED ORDER — HYDROCODONE-IBUPROFEN 7.5-200 MG PO TABS: ORAL_TABLET | ORAL | 0 refills | Status: DC

## 2016-08-19 NOTE — Addendum Note (Signed)
Addended by: Nani GasserMETHENEY, Ruther Ephraim D on: 08/19/2016 02:37 PM   Modules accepted: Orders

## 2016-08-19 NOTE — Progress Notes (Signed)
Subjective:    CC:   HPI:  F/U fibromyalgia - on sertraline.  He is doing well on the medication but has been flaring. Her mother has been very ill with congestive heart failure and has been in and out of the hospital. She is now back home but has some testing planned for later this week.  Osteoarthritis of multiple She uses tramadol and occasionally Vicoprofen. She left her medications at home she's been staying with her mother and helping take care of her while she's been in and out of the hospital. She wants to know if she can get a short prescription to use here locally.  BP has been elevated the last few times she has been here.   Past medical history, Surgical history, Family history not pertinant except as noted below, Social history, Allergies, and medications have been entered into the medical record, reviewed, and corrections made.   Review of Systems: No fevers, chills, night sweats, weight loss, chest pain, or shortness of breath.   Objective:    General: Well Developed, well nourished, and in no acute distress.  Neuro: Alert and oriented x3, extra-ocular muscles intact, sensation grossly intact.  HEENT: Normocephalic, atraumatic  Skin: Warm and dry, no rashes. Cardiac: Regular rate and rhythm, no murmurs rubs or gallops, no lower extremity edema.  Respiratory: Clear to auscultation bilaterally. Not using accessory muscles, speaking in full sentences.   Impression and Recommendations:    Fibromyalgia-she has been flaring recently. Definitely think is secondary to recent increased stress levels. Continue with sertraline for now. We also had a long discussion about mindfulness and finding things to do for herself that helps her relax and unwind and intern that will help with her pain. Now she is reducing her stress by smoking. We discussed that probably not the best way.  Hypertension-new diagnosis.  Will start lisinopril. Warned about potential side effects. Follow up in 4  weeks for nurse blood pressure check and a BMP.  Osteoarthritis of multiple joints-she's been staying with her mother who has congestive heart failure and was recently in the hospital and will be staying a few more days that she can get some testing done. She wanted to know if she could have a short prescription of hydrocodone for fill locally as she left the medication at her home. Given a prescription for 6 tabs to fill locally.

## 2016-08-20 ENCOUNTER — Telehealth: Payer: Self-pay | Admitting: *Deleted

## 2016-08-20 NOTE — Telephone Encounter (Signed)
PA submitted for Pennsaid

## 2016-08-24 MED ORDER — DICLOFENAC SODIUM 1.5 % TD SOLN: TRANSDERMAL | 99 refills | Status: DC

## 2016-08-24 NOTE — Telephone Encounter (Signed)
pennsaid has been denied.Insurance says she has to have tried and failed diclofenac 1 % gel and diclofenac 1.5 % solution . I f  You want to write for diclofenac the patient can pay $30 out of pocket for this because this will not be covered either since her diagnosis is not specifically osteoarthritis of the knee.( even though she has osteoarthritis of multiple joints which is what was put on PA form)

## 2016-08-24 NOTE — Telephone Encounter (Signed)
Ok ,new rx sent for 1.5% solution.

## 2016-08-26 ENCOUNTER — Other Ambulatory Visit: Payer: Self-pay | Admitting: Family Medicine

## 2016-08-26 NOTE — Telephone Encounter (Signed)
Patient would like to continue on the Hydrocodone and wean off the Klonopin. Will route to PCP for review.

## 2016-08-26 NOTE — Telephone Encounter (Signed)
Call pt:  based on new narcotic guidelines there is a 4 fold risk of death in those who take benzos and narcotics regularly.  She will need to wean off of one or the other. See what she would like to do.

## 2016-08-26 NOTE — Telephone Encounter (Signed)
Left message on vm

## 2016-08-26 NOTE — Telephone Encounter (Signed)
Left VM for Pt to return clinic call.  

## 2016-08-27 MED ORDER — CLONAZEPAM 0.5 MG PO TABS: ORAL_TABLET | ORAL | 0 refills | Status: DC

## 2016-08-27 MED ORDER — HYDROCODONE-IBUPROFEN 7.5-200 MG PO TABS: 1.0000 | ORAL_TABLET | Freq: Two times a day (BID) | ORAL | 0 refills | Status: DC | PRN

## 2016-08-27 NOTE — Telephone Encounter (Signed)
Taper written for klonopin

## 2016-09-14 ENCOUNTER — Other Ambulatory Visit: Payer: Self-pay | Admitting: *Deleted

## 2016-09-14 ENCOUNTER — Other Ambulatory Visit: Payer: Self-pay | Admitting: Family Medicine

## 2016-09-14 MED ORDER — TRAMADOL HCL 50 MG PO TABS
50.0000 mg | ORAL_TABLET | Freq: Three times a day (TID) | ORAL | 0 refills | Status: DC | PRN
Start: 2016-09-14 — End: 2016-10-11

## 2016-09-14 NOTE — Progress Notes (Signed)
rx printed and faxed. lvm informing pt.Lori PacasBarkley, Quilla Freeze BodegaLynetta

## 2016-09-20 ENCOUNTER — Ambulatory Visit: Payer: BLUE CROSS/BLUE SHIELD

## 2016-09-27 ENCOUNTER — Ambulatory Visit (INDEPENDENT_AMBULATORY_CARE_PROVIDER_SITE_OTHER): Payer: BLUE CROSS/BLUE SHIELD | Admitting: Family Medicine

## 2016-09-27 VITALS — BP 132/78 | HR 89 | Wt 149.0 lb

## 2016-09-27 DIAGNOSIS — I10 Essential (primary) hypertension: Secondary | ICD-10-CM | POA: Diagnosis not present

## 2016-09-27 LAB — BASIC METABOLIC PANEL WITH GFR
BUN: 10 mg/dL (ref 7–25)
CHLORIDE: 105 mmol/L (ref 98–110)
CO2: 25 mmol/L (ref 20–31)
Calcium: 9.9 mg/dL (ref 8.6–10.4)
Creat: 0.63 mg/dL (ref 0.50–1.05)
GFR, Est African American: >89 mL/min (ref >=60)
Glucose, Bld: 93 mg/dL (ref 65–99)
Potassium: 4.1 mmol/L (ref 3.5–5.3)
SODIUM: 139 mmol/L (ref 135–146)

## 2016-09-27 MED ORDER — HYDROCODONE-IBUPROFEN 7.5-200 MG PO TABS: 1.0000 | ORAL_TABLET | Freq: Two times a day (BID) | ORAL | 0 refills | Status: DC | PRN

## 2016-09-27 NOTE — Progress Notes (Signed)
Patient came into clinic today for BP check. Pt states she has been taking her medications as prescribed, reports no complications. Pt also mentions she is weaning off the Klonopin, currently she is taking 0.25mg  daily. Pt is due for refill on her Vicoprofen and due for repeat BMP. Rx refilled today and lab slip printed. Pt will go to lab today for completion. No further questions.

## 2016-09-27 NOTE — Progress Notes (Signed)
Agree with above. BP much better.   Nani Gasseratherine Rebecah Dangerfield, MD

## 2016-09-28 NOTE — Progress Notes (Signed)
All labs are normal. 

## 2016-10-11 ENCOUNTER — Other Ambulatory Visit: Payer: Self-pay | Admitting: Family Medicine

## 2016-10-12 ENCOUNTER — Telehealth: Payer: Self-pay | Admitting: Family Medicine

## 2016-10-12 NOTE — Telephone Encounter (Signed)
Sent prior authorization through cover my meds waiting on determination. - CF °

## 2016-10-12 NOTE — Telephone Encounter (Signed)
[facility] so I filled out the form and faxed it back now waiting on determination. - CF

## 2016-10-13 ENCOUNTER — Other Ambulatory Visit: Payer: Self-pay | Admitting: Family Medicine

## 2016-10-15 NOTE — Telephone Encounter (Signed)
[facility] request for Diclofenac Sodium from 10/12/16 - 10/24/2038. Reference # [id number] I will call and inform pharmacy. - CF

## 2016-10-26 ENCOUNTER — Other Ambulatory Visit: Payer: Self-pay | Admitting: *Deleted

## 2016-10-26 MED ORDER — HYDROCODONE-IBUPROFEN 7.5-200 MG PO TABS: 1.0000 | ORAL_TABLET | Freq: Two times a day (BID) | ORAL | 0 refills | Status: DC | PRN

## 2016-10-26 NOTE — Progress Notes (Signed)
lvm informing pt to p/u rx at front desk.Loralee PacasBarkley, Andrea Colglazier CunardLynetta

## 2016-11-11 ENCOUNTER — Other Ambulatory Visit: Payer: Self-pay | Admitting: Family Medicine

## 2016-11-18 ENCOUNTER — Ambulatory Visit: Payer: BLUE CROSS/BLUE SHIELD | Admitting: Family Medicine

## 2016-11-24 ENCOUNTER — Other Ambulatory Visit: Payer: Self-pay

## 2016-11-24 MED ORDER — HYDROCODONE-IBUPROFEN 7.5-200 MG PO TABS: 1.0000 | ORAL_TABLET | Freq: Two times a day (BID) | ORAL | 0 refills | Status: DC | PRN

## 2016-11-26 ENCOUNTER — Ambulatory Visit: Payer: BLUE CROSS/BLUE SHIELD | Admitting: Family Medicine

## 2016-12-03 ENCOUNTER — Ambulatory Visit: Payer: BLUE CROSS/BLUE SHIELD | Admitting: Family Medicine

## 2016-12-09 ENCOUNTER — Other Ambulatory Visit: Payer: Self-pay | Admitting: Family Medicine

## 2016-12-09 ENCOUNTER — Other Ambulatory Visit: Payer: Self-pay

## 2016-12-09 MED ORDER — TRAMADOL HCL 50 MG PO TABS: 50.0000 mg | ORAL_TABLET | Freq: Three times a day (TID) | ORAL | 0 refills | Status: DC | PRN

## 2016-12-13 ENCOUNTER — Telehealth: Payer: Self-pay | Admitting: Family Medicine

## 2016-12-13 NOTE — Telephone Encounter (Signed)
Called pt about flu shot declined

## 2016-12-27 ENCOUNTER — Encounter: Payer: Self-pay | Admitting: Family Medicine

## 2016-12-27 ENCOUNTER — Ambulatory Visit (INDEPENDENT_AMBULATORY_CARE_PROVIDER_SITE_OTHER): Payer: BLUE CROSS/BLUE SHIELD | Admitting: Family Medicine

## 2016-12-27 VITALS — BP 139/75 | HR 94 | Wt 146.0 lb

## 2016-12-27 DIAGNOSIS — I10 Essential (primary) hypertension: Secondary | ICD-10-CM | POA: Diagnosis not present

## 2016-12-27 DIAGNOSIS — E7800 Pure hypercholesterolemia, unspecified: Secondary | ICD-10-CM

## 2016-12-27 DIAGNOSIS — M797 Fibromyalgia: Secondary | ICD-10-CM | POA: Diagnosis not present

## 2016-12-27 DIAGNOSIS — K760 Fatty (change of) liver, not elsewhere classified: Secondary | ICD-10-CM | POA: Diagnosis not present

## 2016-12-27 MED ORDER — HYDROCODONE-IBUPROFEN 7.5-200 MG PO TABS: 1.0000 | ORAL_TABLET | Freq: Two times a day (BID) | ORAL | 0 refills | Status: DC | PRN

## 2016-12-27 MED ORDER — PREMARIN 0.625 MG PO TABS: 0.6250 mg | ORAL_TABLET | Freq: Every day | ORAL | 5 refills | Status: DC

## 2016-12-27 NOTE — Patient Instructions (Addendum)
Please schedule your mammogram. You must get your mammogram before next refill on the Hormones.  You have to get a yearly mammogram while on hormones.

## 2016-12-27 NOTE — Progress Notes (Signed)
Subjective:    CC: 6 mo f/u   HPI: Hypertension- Pt denies chest pain, SOB, dizziness, or heart palpitations.  Taking meds as directed w/o problems.  Denies medication side effects.    Fibromyalgia - She has weaned completely off her clonazepam now and is doing well. She is using tramadol as needed for pain relief. In fact we just refilled it about 2 weeks ago. She's also on sertraline 50 mg daily. She hasn't been sleeping as well since coming off of the clonazepam but overall feels like her sleep is fair.   Fatty liver-due to recheck liver enzymes. Lab Results  Component Value Date   ALT 13 10/24/2015   AST 14 10/24/2015   ALKPHOS 58 10/24/2015   BILITOT 0.5 10/24/2015   Hormone replacement therapy- She is requesting a refill on her Premarin. Her mammogram is not up-to-date.  Past medical history, Surgical history, Family history not pertinant except as noted below, Social history, Allergies, and medications have been entered into the medical record, reviewed, and corrections made.   Review of Systems: No fevers, chills, night sweats, weight loss, chest pain, or shortness of breath.   Objective:    General: Well Developed, well nourished, and in no acute distress.  Neuro: Alert and oriented x3, extra-ocular muscles intact, sensation grossly intact.  HEENT: Normocephalic, atraumatic  Skin: Warm and dry, no rashes. Cardiac: Regular rate and rhythm, no murmurs rubs or gallops, no lower extremity edema.  Respiratory: Clear to auscultation bilaterally. Not using accessory muscles, speaking in full sentences.   Impression and Recommendations:    HTN - Well controlled. Continue current regimen. Follow up in  3-6 months.    Fibromyalgia - stable. She has a little bit of flare recently with colder days but now is getting a little warmer inside her as we have into spring she's actually been feeling a little bit better. Continue current regimen. I did refill her hydrocodone today.  Follow-up in 3 months.  Fatty liver-due to recheck liver enzymes.  Hormone replacement therapy-I will refill her Premarin for 6 more months but she absolutely has to get a mammogram before next refill she is are 82 years pass do on her mammogram. Encouraged her to walk downstairs today and actually get that scheduled as soon as possible.

## 2016-12-28 LAB — COMPLETE METABOLIC PANEL WITH GFR
ALT: 11 U/L (ref 6–29)
AST: 11 U/L (ref 10–35)
Albumin: 4 g/dL (ref 3.6–5.1)
Alkaline Phosphatase: 53 U/L (ref 33–130)
BUN: 13 mg/dL (ref 7–25)
CALCIUM: 9.4 mg/dL (ref 8.6–10.4)
CHLORIDE: 106 mmol/L (ref 98–110)
CO2: 27 mmol/L (ref 20–31)
CREATININE: 0.68 mg/dL (ref 0.50–1.05)
GFR, Est African American: >89 mL/min (ref >=60)
GFR, Est Non African American: >89 mL/min (ref >=60)
Glucose, Bld: 90 mg/dL (ref 65–99)
POTASSIUM: 4.3 mmol/L (ref 3.5–5.3)
Sodium: 140 mmol/L (ref 135–146)
Total Bilirubin: 0.4 mg/dL (ref 0.2–1.2)
Total Protein: 6.4 g/dL (ref 6.1–8.1)

## 2016-12-28 LAB — LIPID PANEL W/REFLEX DIRECT LDL
CHOLESTEROL: 247 mg/dL — AB (ref <200)
HDL: 39 mg/dL — AB (ref >50)
LDL-Cholesterol: 164 mg/dL — ABNORMAL HIGH
NON-HDL CHOLESTEROL (CALC): 208 mg/dL — AB (ref <130)
TRIGLYCERIDES: 267 mg/dL — AB (ref <150)
Total CHOL/HDL Ratio: 6.3 Ratio — ABNORMAL HIGH (ref <5.0)

## 2016-12-29 ENCOUNTER — Other Ambulatory Visit: Payer: Self-pay | Admitting: Family Medicine

## 2016-12-29 DIAGNOSIS — Z1231 Encounter for screening mammogram for malignant neoplasm of breast: Secondary | ICD-10-CM

## 2017-01-06 ENCOUNTER — Other Ambulatory Visit: Payer: Self-pay | Admitting: Family Medicine

## 2017-01-19 ENCOUNTER — Ambulatory Visit (INDEPENDENT_AMBULATORY_CARE_PROVIDER_SITE_OTHER): Payer: BLUE CROSS/BLUE SHIELD

## 2017-01-19 DIAGNOSIS — Z1231 Encounter for screening mammogram for malignant neoplasm of breast: Secondary | ICD-10-CM

## 2017-01-26 ENCOUNTER — Telehealth: Payer: Self-pay

## 2017-01-26 ENCOUNTER — Other Ambulatory Visit: Payer: Self-pay | Admitting: *Deleted

## 2017-01-26 DIAGNOSIS — M797 Fibromyalgia: Secondary | ICD-10-CM

## 2017-01-26 MED ORDER — PRAVASTATIN SODIUM 20 MG PO TABS: 20.0000 mg | ORAL_TABLET | Freq: Every day | ORAL | 3 refills | Status: DC

## 2017-01-26 MED ORDER — HYDROCODONE-IBUPROFEN 7.5-200 MG PO TABS: 1.0000 | ORAL_TABLET | Freq: Two times a day (BID) | ORAL | 0 refills | Status: DC | PRN

## 2017-01-26 NOTE — Telephone Encounter (Signed)
Notified patient.

## 2017-01-26 NOTE — Telephone Encounter (Signed)
rx sent for pravastatin.  

## 2017-01-26 NOTE — Telephone Encounter (Signed)
Pt called back, she is ok with starting cholesterol medication, and she said it was time to have her hydrocodone refilled.

## 2017-01-31 ENCOUNTER — Other Ambulatory Visit: Payer: Self-pay | Admitting: Family Medicine

## 2017-01-31 ENCOUNTER — Ambulatory Visit (INDEPENDENT_AMBULATORY_CARE_PROVIDER_SITE_OTHER): Payer: BLUE CROSS/BLUE SHIELD | Admitting: Family Medicine

## 2017-01-31 ENCOUNTER — Ambulatory Visit (INDEPENDENT_AMBULATORY_CARE_PROVIDER_SITE_OTHER): Payer: BLUE CROSS/BLUE SHIELD

## 2017-01-31 VITALS — BP 142/72 | HR 96 | Wt 146.0 lb

## 2017-01-31 DIAGNOSIS — M25572 Pain in left ankle and joints of left foot: Secondary | ICD-10-CM | POA: Diagnosis not present

## 2017-01-31 DIAGNOSIS — M797 Fibromyalgia: Secondary | ICD-10-CM | POA: Diagnosis not present

## 2017-01-31 DIAGNOSIS — S92025A Nondisplaced fracture of anterior process of left calcaneus, initial encounter for closed fracture: Secondary | ICD-10-CM | POA: Diagnosis not present

## 2017-01-31 DIAGNOSIS — W109XXA Fall (on) (from) unspecified stairs and steps, initial encounter: Secondary | ICD-10-CM | POA: Diagnosis not present

## 2017-01-31 DIAGNOSIS — S92155A Nondisplaced avulsion fracture (chip fracture) of left talus, initial encounter for closed fracture: Secondary | ICD-10-CM | POA: Diagnosis not present

## 2017-01-31 DIAGNOSIS — S92142A Displaced dome fracture of left talus, initial encounter for closed fracture: Secondary | ICD-10-CM | POA: Diagnosis not present

## 2017-01-31 DIAGNOSIS — S92902B Unspecified fracture of left foot, initial encounter for open fracture: Secondary | ICD-10-CM

## 2017-01-31 MED ORDER — HYDROCODONE-IBUPROFEN 7.5-200 MG PO TABS: 1.0000 | ORAL_TABLET | ORAL | 0 refills | Status: DC | PRN
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
## 2017-02-01 ENCOUNTER — Ambulatory Visit (INDEPENDENT_AMBULATORY_CARE_PROVIDER_SITE_OTHER): Payer: BLUE CROSS/BLUE SHIELD

## 2017-02-01 ENCOUNTER — Other Ambulatory Visit: Payer: Self-pay | Admitting: Family Medicine

## 2017-02-01 DIAGNOSIS — S92002A Unspecified fracture of left calcaneus, initial encounter for closed fracture: Secondary | ICD-10-CM

## 2017-02-01 DIAGNOSIS — S92902B Unspecified fracture of left foot, initial encounter for open fracture: Secondary | ICD-10-CM

## 2017-02-01 DIAGNOSIS — W109XXA Fall (on) (from) unspecified stairs and steps, initial encounter: Secondary | ICD-10-CM

## 2017-02-01 DIAGNOSIS — S92025A Nondisplaced fracture of anterior process of left calcaneus, initial encounter for closed fracture: Secondary | ICD-10-CM

## 2017-02-01 DIAGNOSIS — S92112A Displaced fracture of neck of left talus, initial encounter for closed fracture: Secondary | ICD-10-CM | POA: Diagnosis not present

## 2017-02-04 ENCOUNTER — Ambulatory Visit: Payer: BLUE CROSS/BLUE SHIELD

## 2017-02-07 ENCOUNTER — Other Ambulatory Visit: Payer: Self-pay | Admitting: Family Medicine

## 2017-02-07 ENCOUNTER — Ambulatory Visit (INDEPENDENT_AMBULATORY_CARE_PROVIDER_SITE_OTHER): Payer: BLUE CROSS/BLUE SHIELD | Admitting: Family Medicine

## 2017-02-07 VITALS — BP 138/59 | HR 85 | Wt 148.0 lb

## 2017-02-07 DIAGNOSIS — S92025A Nondisplaced fracture of anterior process of left calcaneus, initial encounter for closed fracture: Secondary | ICD-10-CM | POA: Diagnosis not present

## 2017-02-07 DIAGNOSIS — S92152A Displaced avulsion fracture (chip fracture) of left talus, initial encounter for closed fracture: Secondary | ICD-10-CM

## 2017-02-07 DIAGNOSIS — M797 Fibromyalgia: Secondary | ICD-10-CM

## 2017-02-07 MED ORDER — HYDROCODONE-IBUPROFEN 7.5-200 MG PO TABS: 1.0000 | ORAL_TABLET | Freq: Three times a day (TID) | ORAL | 0 refills | Status: DC | PRN

## 2017-02-07 MED ORDER — TRAMADOL HCL 50 MG PO TABS: 50.0000 mg | ORAL_TABLET | Freq: Three times a day (TID) | ORAL | 0 refills | Status: DC | PRN

## 2017-02-07 NOTE — Progress Notes (Signed)
   Subjective:    Patient ID: Lori [name], female    DOB: 1961-02-22, 56 y.o.   MRN: [id number]  Laneshia has a fracture of her left foot. She will have a CAM boot placed today.    Foot Injury        Review of Systems     Objective:   Physical Exam        Assessment & Plan:  Foot Fracture - Reviewed results of xray. Boot fit well. Advised to call if pain worsens or persists.

## 2017-02-07 NOTE — Progress Notes (Signed)
Will f/u with sports med in one week.  Refilled her hydrocodone for Q 8 hours.

## 2017-02-14 ENCOUNTER — Encounter: Payer: Self-pay | Admitting: Sports Medicine

## 2017-02-14 ENCOUNTER — Ambulatory Visit (INDEPENDENT_AMBULATORY_CARE_PROVIDER_SITE_OTHER): Payer: BLUE CROSS/BLUE SHIELD | Admitting: Sports Medicine

## 2017-02-14 DIAGNOSIS — S92902A Unspecified fracture of left foot, initial encounter for closed fracture: Secondary | ICD-10-CM | POA: Insufficient documentation

## 2017-02-14 DIAGNOSIS — F17200 Nicotine dependence, unspecified, uncomplicated: Secondary | ICD-10-CM

## 2017-02-14 HISTORY — DX: Unspecified fracture of left foot, initial encounter for closed fracture: S92.902A

## 2017-02-14 MED ORDER — VARENICLINE TARTRATE 0.5 MG X 11 & 1 MG X 42 PO MISC: ORAL | 0 refills | Status: DC

## 2017-02-14 MED ORDER — HYDROCODONE-ACETAMINOPHEN 10-325 MG PO TABS: 1.0000 | ORAL_TABLET | Freq: Three times a day (TID) | ORAL | 0 refills | Status: DC | PRN

## 2017-02-14 NOTE — Progress Notes (Signed)
   Subjective:    I'm seeing this patient as a consultation for:  Dr. Timothy Lasso  CC: left foot fracture  HPI: Lori [name] is a 56 y.o. Female with a history of smoking and chronic pain who presents for follow-up for left foot fracture. She was walking down the stairs in clogs that didn't fit quite right, inverted her ankle, heard a crack, and fell down. She was unable to put any weight on the foot, and researched online the RICE protocol, which she followed.   X-rays showed closed non-displaced fracture of the anterior process of the calcaneus and closed avulsion fracture of the talus on the left foot.  She was prescribed a CAM boot, which she has been using. She sometimes removes the boot for RICE when she can, but never bears weight on the leg without the boot. She has been taking the maximum daily dose of ibuprofen of 3000 mg, including the 200 mg component of vicoprofen that she was prescribed for chronic pain. She has a pain contract with Dr. Darra Lis.  Past medical history:  Osteoarthrosis, polyarthralgia, fibromyalgia.  See flowsheet/record as well for more information.  Surgical history: Negative.  See flowsheet/record as well for more information.  Family history: Negative.  See flowsheet/record as well for more information.  Social history: Smoking.  See flowsheet/record as well for more information.  Allergies, and medications have been entered into the medical record, reviewed, and no changes needed.   Review of Systems: No headache, visual changes, nausea, vomiting, diarrhea, constipation, dizziness, abdominal pain, skin rash, fevers, chills, night sweats, weight loss, swollen lymph nodes, body aches, joint swelling, muscle aches, chest pain, shortness of breath, mood changes, visual or auditory hallucinations.   Objective:   General: Well Developed, well nourished, and in no acute distress.  Neuro/Psych: Alert and oriented x3, extra-ocular muscles intact, able to move  all 4 extremities, sensation grossly intact. Skin: Warm and dry, no rashes noted.  Respiratory: Not using accessory muscles, speaking in full sentences, trachea midline.  Cardiovascular: Pulses palpable, no extremity edema. Abdomen: Does not appear distended. Left Ankle: Ankle is swollen and erythematous primarily over the anterolateral aspect. Restricted ROM in all planes, but primarily on dorsiflexion and inversion TTP at the lateral malleolus and at the calcaneocuboidal joint    Impression and Recommendations:   This case required medical decision making of moderate complexity.  Lori [name] is a 56 y.o. Female with a history of smoking and chronic pain who presents for follow-up of closed non-displaced fracture of the anterior process of the calcaneus and closed avulsion fracture of the talus on the left foot.   History of smoking likely contributes to slow healing, so patient is amenable to try quitting. Chantix was prescribed for smoking cessation.  Evidence suggests that NSAID use delays bone healing, so vicoprofen (7.5-200 mg) was switched to 10 mg vicoden for pain relief.  Patient was advised to continue using CAM boot, and to follow RICE protocol. Will follow-up in one month to consider switching to ankle brace.

## 2017-02-14 NOTE — Assessment & Plan Note (Signed)
Stop smoking, continue boot for 4 weeks. Fractures of the talus and the anterior process of the calcaneus. Switching from Vicoprofen to Vicodin.

## 2017-02-14 NOTE — Assessment & Plan Note (Signed)
Needs to stop while the fracture heals, adding Chantix, she's had success with this couple of times in the past.

## 2017-02-25 ENCOUNTER — Telehealth: Payer: Self-pay

## 2017-02-25 DIAGNOSIS — M797 Fibromyalgia: Secondary | ICD-10-CM

## 2017-02-25 MED ORDER — HYDROCODONE-IBUPROFEN 7.5-200 MG PO TABS: 1.0000 | ORAL_TABLET | Freq: Three times a day (TID) | ORAL | 0 refills | Status: DC | PRN

## 2017-02-25 NOTE — Telephone Encounter (Signed)
Patient called requested a refill for Hydrocodone -ibuprofen 7.5-200 mg. Patient states that it tends to work better. Please advise.  Rhonda Cunningham,CMA

## 2017-02-25 NOTE — Telephone Encounter (Signed)
In box

## 2017-02-25 NOTE — Telephone Encounter (Signed)
Per Dr. Karie Schwalbe rx was approved. Sachit Gilman,CMA

## 2017-02-25 NOTE — Telephone Encounter (Signed)
Patient has been informed. Rhonda Cunningham,CMA  

## 2017-03-02 ENCOUNTER — Telehealth: Payer: Self-pay | Admitting: Family Medicine

## 2017-03-02 DIAGNOSIS — M797 Fibromyalgia: Secondary | ICD-10-CM

## 2017-03-02 DIAGNOSIS — S92902A Unspecified fracture of left foot, initial encounter for closed fracture: Secondary | ICD-10-CM

## 2017-03-02 MED ORDER — HYDROCODONE-ACETAMINOPHEN 10-325 MG PO TABS: ORAL_TABLET | ORAL | 0 refills | Status: DC

## 2017-03-02 NOTE — Telephone Encounter (Signed)
No ibuprofen or NSAIDs. This is contraindicated with a fracture and can delay healing.

## 2017-03-02 NOTE — Telephone Encounter (Signed)
Patient called requested a refill for Hydrocodone-Ibuprofen 7.5-200 mg. Patient states it helps her ankle pain better, despite being advised Tylenol would be better for her. Patient is under pain contact but has been getting other Rx's from Dr. Benjamin Stainhekkekandam due to acute ankle injury. Patient was given a supply of quantity #20 on 02/25/17, this Rx is out. Routing to PCP and treating Physician for review.

## 2017-03-02 NOTE — Telephone Encounter (Signed)
Pt advised. Verbalized understanding.

## 2017-03-02 NOTE — Telephone Encounter (Signed)
Adding some vicodin in a down taper, no more after that.

## 2017-03-07 ENCOUNTER — Telehealth: Payer: Self-pay | Admitting: *Deleted

## 2017-03-08 MED ORDER — HYDROCODONE-ACETAMINOPHEN 7.5-325 MG PO TABS: 1.0000 | ORAL_TABLET | Freq: Every day | ORAL | 0 refills | Status: DC | PRN

## 2017-03-08 NOTE — Telephone Encounter (Signed)
Left message on patient vm advising that of information as noted below. Rhonda Cunningham,CMA

## 2017-03-08 NOTE — Telephone Encounter (Signed)
I refill with the hydrocodone with Tylenol, not the ibuprofen component because again she has a fracture and she is still healing. Hopefully on her next refill in a month we connections switch her back to the Vicoprofen.

## 2017-03-08 NOTE — Telephone Encounter (Signed)
Pt called stating that she is about out of her pain medication and understands that Dr. Karie Schwalbe was weaning her down of the medication that he was giving her. She would like a refill of what Dr. Linford ArnoldMetheney writes for her.   Will fwd to pcp for advice.Loralee PacasBarkley, Trang Bouse SheffieldLynetta

## 2017-03-10 ENCOUNTER — Other Ambulatory Visit: Payer: Self-pay | Admitting: Family Medicine

## 2017-03-11 ENCOUNTER — Other Ambulatory Visit: Payer: Self-pay | Admitting: Family Medicine

## 2017-03-14 ENCOUNTER — Encounter: Payer: Self-pay | Admitting: Sports Medicine

## 2017-03-14 ENCOUNTER — Ambulatory Visit (INDEPENDENT_AMBULATORY_CARE_PROVIDER_SITE_OTHER): Payer: BLUE CROSS/BLUE SHIELD | Admitting: Sports Medicine

## 2017-03-14 DIAGNOSIS — S92902A Unspecified fracture of left foot, initial encounter for closed fracture: Secondary | ICD-10-CM

## 2017-03-14 NOTE — Progress Notes (Signed)
  Subjective:    CC: Follow-up  HPI: This is a pleasant 56 year old female, she is 4 weeks now post fracture of the anterior process of the calcaneus as well as talar fracture. Overall she is pain-free.  Past medical history:  Negative.  See flowsheet/record as well for more information.  Surgical history: Negative.  See flowsheet/record as well for more information.  Family history: Negative.  See flowsheet/record as well for more information.  Social history: Negative.  See flowsheet/record as well for more information.  Allergies, and medications have been entered into the medical record, reviewed, and no changes needed.   Review of Systems: No fevers, chills, night sweats, weight loss, chest pain, or shortness of breath.   Objective:    General: Well Developed, well nourished, and in no acute distress.  Neuro: Alert and oriented x3, extra-ocular muscles intact, sensation grossly intact.  HEENT: Normocephalic, atraumatic, pupils equal round reactive to light, neck supple, no masses, no lymphadenopathy, thyroid nonpalpable.  Skin: Warm and dry, no rashes. Cardiac: Regular rate and rhythm, no murmurs rubs or gallops, no lower extremity edema.  Respiratory: Clear to auscultation bilaterally. Not using accessory muscles, speaking in full sentences. Left Foot: No visible erythema or swelling. Range of motion is full in all directions. Strength is 5/5 in all directions. No hallux valgus. No pes cavus or pes planus. No abnormal callus noted. No pain over the navicular prominence, or base of fifth metatarsal. No tenderness to palpation of the calcaneal insertion of plantar fascia. No pain at the Achilles insertion. No pain over the calcaneal bursa. No pain of the retrocalcaneal bursa. No tenderness to palpation over the tarsals, metatarsals, or phalanges. No hallux rigidus or limitus. No tenderness palpation over interphalangeal joints. No pain with compression of the metatarsal  heads. Neurovascularly intact distally.  Impression and Recommendations:    Fracture, foot, left, closed, initial encounter Overall doing well, she is 4 weeks in the boot, with fractures of the talus in the anterior process of the calcaneus. Discontinue pain medications for now. May return to a normal shoe. Return in one month.

## 2017-03-14 NOTE — Assessment & Plan Note (Signed)
Overall doing well, she is 4 weeks in the boot, with fractures of the talus in the anterior process of the calcaneus. Discontinue pain medications for now. May return to a normal shoe. Return in one month.

## 2017-03-29 ENCOUNTER — Ambulatory Visit: Payer: BLUE CROSS/BLUE SHIELD | Admitting: Family Medicine

## 2017-04-08 ENCOUNTER — Other Ambulatory Visit: Payer: Self-pay | Admitting: Family Medicine

## 2017-04-08 ENCOUNTER — Encounter: Payer: Self-pay | Admitting: Family Medicine

## 2017-04-08 ENCOUNTER — Ambulatory Visit (INDEPENDENT_AMBULATORY_CARE_PROVIDER_SITE_OTHER): Payer: BLUE CROSS/BLUE SHIELD | Admitting: Family Medicine

## 2017-04-08 VITALS — BP 128/84 | HR 117 | Ht 65.65 in | Wt 143.0 lb

## 2017-04-08 DIAGNOSIS — M797 Fibromyalgia: Secondary | ICD-10-CM

## 2017-04-08 DIAGNOSIS — I10 Essential (primary) hypertension: Secondary | ICD-10-CM

## 2017-04-08 DIAGNOSIS — G8929 Other chronic pain: Secondary | ICD-10-CM | POA: Diagnosis not present

## 2017-04-08 DIAGNOSIS — S92902A Unspecified fracture of left foot, initial encounter for closed fracture: Secondary | ICD-10-CM

## 2017-04-08 DIAGNOSIS — B372 Candidiasis of skin and nail: Secondary | ICD-10-CM

## 2017-04-08 MED ORDER — NYSTATIN 100000 UNIT/GM EX CREA: 1.0000 "application " | TOPICAL_CREAM | Freq: Two times a day (BID) | CUTANEOUS | 1 refills | Status: DC

## 2017-04-08 MED ORDER — HYDROCODONE-IBUPROFEN 7.5-200 MG PO TABS: 1.0000 | ORAL_TABLET | Freq: Two times a day (BID) | ORAL | 0 refills | Status: DC | PRN
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
## 2017-04-11 ENCOUNTER — Other Ambulatory Visit: Payer: Self-pay | Admitting: Family Medicine

## 2017-04-12 LAB — PAIN MGMT, OPIATES W/CONF, U

## 2017-04-12 LAB — PAIN MGMT, PROFILE 6 CONF W/O MM, U
6 Acetylmorphine: NEGATIVE ng/mL (ref <10)
ALPHAHYDROXYALPRAZOLAM: NEGATIVE ng/mL (ref <25)
AMPHETAMINES: NEGATIVE ng/mL (ref <500)
Alcohol Metabolites: NEGATIVE ng/mL (ref <500)
Alphahydroxymidazolam: NEGATIVE ng/mL (ref <50)
Alphahydroxytriazolam: NEGATIVE ng/mL (ref <50)
Aminoclonazepam: NEGATIVE ng/mL (ref <25)
BENZODIAZEPINES: NEGATIVE ng/mL (ref <100)
Barbiturates: NEGATIVE ng/mL (ref <300)
CREATININE: 67.8 mg/dL (ref >=20.0)
Cocaine Metabolite: NEGATIVE ng/mL (ref <150)
Hydroxyethylflurazepam: NEGATIVE ng/mL (ref <50)
Lorazepam: NEGATIVE ng/mL (ref <50)
MARIJUANA METABOLITE: NEGATIVE ng/mL (ref <20)
METHADONE METABOLITE: NEGATIVE ng/mL (ref <100)
Nordiazepam: NEGATIVE ng/mL (ref <50)
OPIATES: NEGATIVE ng/mL (ref <100)
OXIDANT: NEGATIVE ug/mL (ref <200)
Oxazepam: NEGATIVE ng/mL (ref <50)
Oxycodone: NEGATIVE ng/mL (ref <100)
Phencyclidine: NEGATIVE ng/mL (ref <25)
Please note:: 0
TEMAZEPAM: NEGATIVE ng/mL (ref <50)
pH: 7.18 (ref 4.5–9.0)

## 2017-04-14 ENCOUNTER — Ambulatory Visit: Payer: BLUE CROSS/BLUE SHIELD | Admitting: Sports Medicine

## 2017-04-14 LAB — PAIN MGMT, OPIATES EXP. QN, UR
Codeine: NEGATIVE ng/mL (ref <50)
HYDROMORPHONE: NEGATIVE ng/mL (ref <50)
Hydrocodone: NEGATIVE ng/mL (ref <50)
MORPHINE: NEGATIVE ng/mL (ref <50)
NORHYDROCODONE: NEGATIVE ng/mL (ref <50)
NOROXYCODONE: NEGATIVE ng/mL (ref <50)
OXYCODONE: NEGATIVE ng/mL (ref <50)
Oxymorphone: NEGATIVE ng/mL (ref <50)

## 2017-04-19 LAB — PAIN MGMT, OPIATES EXP. QN, UR

## 2017-04-22 ENCOUNTER — Ambulatory Visit (INDEPENDENT_AMBULATORY_CARE_PROVIDER_SITE_OTHER): Payer: BLUE CROSS/BLUE SHIELD | Admitting: Sports Medicine

## 2017-04-22 ENCOUNTER — Telehealth: Payer: Self-pay | Admitting: *Deleted

## 2017-04-22 DIAGNOSIS — S92902A Unspecified fracture of left foot, initial encounter for closed fracture: Secondary | ICD-10-CM

## 2017-04-22 DIAGNOSIS — G8929 Other chronic pain: Secondary | ICD-10-CM

## 2017-04-22 NOTE — Telephone Encounter (Signed)
UDS ordered

## 2017-04-22 NOTE — Progress Notes (Signed)
  Subjective: 8 weeks post fracture of the left anterior process of the calcaneus as well as talus, doing well, no complaints, a bit of subjective loss of range motion with plantar flexion.   Objective: General: Well-developed, well-nourished, and in no acute distress. Left Foot: No visible erythema or swelling. Range of motion is full in all directions. Strength is 5/5 in all directions. No hallux valgus. No pes cavus or pes planus. No abnormal callus noted. No pain over the navicular prominence, or base of fifth metatarsal. No tenderness to palpation of the calcaneal insertion of plantar fascia. No pain at the Achilles insertion. No pain over the calcaneal bursa. No pain of the retrocalcaneal bursa. No tenderness to palpation over the tarsals, metatarsals, or phalanges. No hallux rigidus or limitus. No tenderness palpation over interphalangeal joints. No pain with compression of the metatarsal heads. Neurovascularly intact distally.  Assessment/plan:   Fracture, foot, left, closed, initial encounter 8 weeks post fracture, doing well, return as needed.

## 2017-04-22 NOTE — Assessment & Plan Note (Addendum)
8 weeks post fracture, doing well, return as needed. She had a bit of subjective lack of plantarflexion but passively I was able to bring in all the way down, and recommended her tracing of the alphabet every night. The fracture is healed.

## 2017-05-01 LAB — PAIN MGMT, OPIATES EXP. QN, UR
Codeine: NEGATIVE ng/mL (ref <50)
Hydrocodone: NEGATIVE ng/mL (ref <50)
Hydromorphone: NEGATIVE ng/mL (ref <50)
MORPHINE: NEGATIVE ng/mL (ref <50)
Norhydrocodone: NEGATIVE ng/mL (ref <50)
Noroxycodone: NEGATIVE ng/mL (ref <50)
OXYCODONE: NEGATIVE ng/mL (ref <50)
OXYMORPHONE: NEGATIVE ng/mL (ref <50)

## 2017-05-05 ENCOUNTER — Other Ambulatory Visit: Payer: Self-pay | Admitting: Family Medicine

## 2017-05-05 DIAGNOSIS — M797 Fibromyalgia: Secondary | ICD-10-CM

## 2017-05-05 NOTE — Telephone Encounter (Signed)
Pt is requesting a refill of vicoprofen. Please advise. -EH/RMA

## 2017-05-06 MED ORDER — HYDROCODONE-IBUPROFEN 7.5-200 MG PO TABS: 1.0000 | ORAL_TABLET | Freq: Two times a day (BID) | ORAL | 0 refills | Status: DC | PRN

## 2017-05-06 NOTE — Telephone Encounter (Signed)
Call patient and let her know okay to pick up prescription today.

## 2017-05-06 NOTE — Telephone Encounter (Signed)
Pt notified to pick rx up this afternoon.

## 2017-05-09 ENCOUNTER — Other Ambulatory Visit: Payer: Self-pay | Admitting: Family Medicine

## 2017-06-06 ENCOUNTER — Other Ambulatory Visit: Payer: Self-pay | Admitting: *Deleted

## 2017-06-06 DIAGNOSIS — M797 Fibromyalgia: Secondary | ICD-10-CM

## 2017-06-06 MED ORDER — LISINOPRIL 10 MG PO TABS: ORAL_TABLET | ORAL | 6 refills | Status: DC

## 2017-06-06 MED ORDER — HYDROCODONE-IBUPROFEN 7.5-200 MG PO TABS
1.0000 | ORAL_TABLET | Freq: Two times a day (BID) | ORAL | 0 refills | Status: DC | PRN
Start: 2017-06-06 — End: 2017-07-06

## 2017-06-06 MED ORDER — SERTRALINE HCL 50 MG PO TABS: 100.0000 mg | ORAL_TABLET | Freq: Every day | ORAL | 3 refills | Status: DC

## 2017-06-08 ENCOUNTER — Ambulatory Visit: Payer: BLUE CROSS/BLUE SHIELD | Admitting: Family Medicine

## 2017-06-10 ENCOUNTER — Other Ambulatory Visit: Payer: Self-pay | Admitting: Family Medicine

## 2017-06-13 ENCOUNTER — Other Ambulatory Visit: Payer: Self-pay | Admitting: Family Medicine

## 2017-07-06 ENCOUNTER — Other Ambulatory Visit: Payer: Self-pay

## 2017-07-06 DIAGNOSIS — M797 Fibromyalgia: Secondary | ICD-10-CM

## 2017-07-06 MED ORDER — HYDROCODONE-IBUPROFEN 7.5-200 MG PO TABS: 1.0000 | ORAL_TABLET | Freq: Two times a day (BID) | ORAL | 0 refills | Status: DC | PRN

## 2017-07-12 ENCOUNTER — Other Ambulatory Visit: Payer: Self-pay | Admitting: *Deleted

## 2017-07-12 MED ORDER — TRAMADOL HCL 50 MG PO TABS: 50.0000 mg | ORAL_TABLET | Freq: Three times a day (TID) | ORAL | 0 refills | Status: DC | PRN

## 2017-07-19 ENCOUNTER — Encounter: Payer: Self-pay | Admitting: Family Medicine

## 2017-07-19 ENCOUNTER — Ambulatory Visit (INDEPENDENT_AMBULATORY_CARE_PROVIDER_SITE_OTHER): Payer: BLUE CROSS/BLUE SHIELD | Admitting: Family Medicine

## 2017-07-19 VITALS — BP 112/58 | HR 82 | Ht 68.0 in | Wt 153.0 lb

## 2017-07-19 DIAGNOSIS — G8929 Other chronic pain: Secondary | ICD-10-CM

## 2017-07-19 DIAGNOSIS — F17200 Nicotine dependence, unspecified, uncomplicated: Secondary | ICD-10-CM | POA: Diagnosis not present

## 2017-07-19 DIAGNOSIS — Z1211 Encounter for screening for malignant neoplasm of colon: Secondary | ICD-10-CM

## 2017-07-19 DIAGNOSIS — M797 Fibromyalgia: Secondary | ICD-10-CM | POA: Diagnosis not present

## 2017-07-19 DIAGNOSIS — Z720 Tobacco use: Secondary | ICD-10-CM

## 2017-07-19 MED ORDER — VARENICLINE TARTRATE 1 MG PO TABS: 1.0000 mg | ORAL_TABLET | Freq: Two times a day (BID) | ORAL | 1 refills | Status: DC

## 2017-07-19 MED ORDER — VARENICLINE TARTRATE 0.5 MG X 11 & 1 MG X 42 PO MISC: ORAL | 0 refills | Status: DC
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
## 2017-08-03 ENCOUNTER — Telehealth: Payer: Self-pay | Admitting: *Deleted

## 2017-08-03 DIAGNOSIS — M797 Fibromyalgia: Secondary | ICD-10-CM

## 2017-08-03 MED ORDER — HYDROCODONE-IBUPROFEN 7.5-200 MG PO TABS: 1.0000 | ORAL_TABLET | Freq: Two times a day (BID) | ORAL | 0 refills | Status: DC | PRN

## 2017-08-03 NOTE — Telephone Encounter (Signed)
Pt called asking for a refill of her Hydrocodone. She knows that she is calling early for this.Lori [name] [place]'  Pt looked up in [place] controlled substance. Last fill 07/06/17. called pt and advised that rx is up front for p/u .Lori [name] [place]

## 2017-08-10 ENCOUNTER — Other Ambulatory Visit: Payer: Self-pay | Admitting: Family Medicine

## 2017-09-01 ENCOUNTER — Other Ambulatory Visit: Payer: Self-pay

## 2017-09-01 DIAGNOSIS — M797 Fibromyalgia: Secondary | ICD-10-CM

## 2017-09-01 MED ORDER — HYDROCODONE-IBUPROFEN 7.5-200 MG PO TABS: 1.0000 | ORAL_TABLET | Freq: Two times a day (BID) | ORAL | 0 refills | Status: DC | PRN

## 2017-09-02 ENCOUNTER — Telehealth: Payer: Self-pay

## 2017-09-02 NOTE — Telephone Encounter (Signed)
PT picked up script for Hydrocodone 11/9 @ 1112am

## 2017-09-08 ENCOUNTER — Other Ambulatory Visit: Payer: Self-pay

## 2017-09-08 MED ORDER — TRAMADOL HCL 50 MG PO TABS: 50.0000 mg | ORAL_TABLET | Freq: Three times a day (TID) | ORAL | 0 refills | Status: DC | PRN

## 2017-09-27 ENCOUNTER — Ambulatory Visit: Payer: BLUE CROSS/BLUE SHIELD | Admitting: Family Medicine

## 2017-09-30 ENCOUNTER — Other Ambulatory Visit: Payer: Self-pay

## 2017-09-30 ENCOUNTER — Other Ambulatory Visit: Payer: Self-pay | Admitting: Family Medicine

## 2017-09-30 DIAGNOSIS — M797 Fibromyalgia: Secondary | ICD-10-CM

## 2017-09-30 MED ORDER — HYDROCODONE-IBUPROFEN 7.5-200 MG PO TABS: 1.0000 | ORAL_TABLET | Freq: Two times a day (BID) | ORAL | 0 refills | Status: DC | PRN
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
## 2017-09-30 NOTE — Telephone Encounter (Signed)
Patient has signed a Editor, commissioningnew contract [facility].

## 2017-09-30 NOTE — Telephone Encounter (Signed)
I am sorry but I meant to change the pharmacy. She [facility]. I have changed the pharmacy.   I called and cancelled the other prescription.

## 2017-09-30 NOTE — Telephone Encounter (Signed)
[facility]

## 2017-09-30 NOTE — Telephone Encounter (Signed)
Pt walked into clinic today requesting the hydrocodone Rx [facility] [address] [place]. Went into detail regarding her pain contact and that this Rx can only be filled at [facility] [place]. Pt states she lives in 2 locations and would like a local pharmacy.   Went over information with PCP. Was advised [facility] [place]. This Rx was cancelled by Cherylann ParrAngela Tuttle, RMA this morning. A new Rx will need to be sent.   Routing to PCP for review.

## 2017-09-30 NOTE — Telephone Encounter (Signed)
Pt called. She would like to pick up hydrocodone script instead. She is coming by today.

## 2017-10-03 ENCOUNTER — Ambulatory Visit: Payer: BLUE CROSS/BLUE SHIELD | Admitting: Family Medicine

## 2017-10-06 ENCOUNTER — Other Ambulatory Visit: Payer: Self-pay

## 2017-10-06 MED ORDER — TRAMADOL HCL 50 MG PO TABS: 50.0000 mg | ORAL_TABLET | Freq: Three times a day (TID) | ORAL | 0 refills | Status: DC | PRN

## 2017-10-06 NOTE — Telephone Encounter (Signed)
She is overdue for her pain management appt.  ONly only did patial refill on her tramadol.

## 2017-10-06 NOTE — Telephone Encounter (Signed)
Patient wants a refill on Tramadol. [facility] [address] [facility].

## 2017-10-28 ENCOUNTER — Ambulatory Visit (INDEPENDENT_AMBULATORY_CARE_PROVIDER_SITE_OTHER): Payer: BLUE CROSS/BLUE SHIELD | Admitting: Family Medicine

## 2017-10-28 ENCOUNTER — Encounter: Payer: Self-pay | Admitting: Family Medicine

## 2017-10-28 VITALS — BP 129/67 | HR 97

## 2017-10-28 DIAGNOSIS — Z720 Tobacco use: Secondary | ICD-10-CM | POA: Diagnosis not present

## 2017-10-28 DIAGNOSIS — G8929 Other chronic pain: Secondary | ICD-10-CM

## 2017-10-28 DIAGNOSIS — M797 Fibromyalgia: Secondary | ICD-10-CM | POA: Diagnosis not present

## 2017-10-28 DIAGNOSIS — F3341 Major depressive disorder, recurrent, in partial remission: Secondary | ICD-10-CM

## 2017-10-28 DIAGNOSIS — I10 Essential (primary) hypertension: Secondary | ICD-10-CM | POA: Diagnosis not present

## 2017-10-28 MED ORDER — TRAMADOL HCL 50 MG PO TABS: 50.0000 mg | ORAL_TABLET | Freq: Three times a day (TID) | ORAL | 0 refills | Status: DC | PRN

## 2017-10-28 MED ORDER — HYDROCODONE-IBUPROFEN 7.5-200 MG PO TABS: 1.0000 | ORAL_TABLET | Freq: Two times a day (BID) | ORAL | 0 refills | Status: DC | PRN

## 2017-10-28 MED ORDER — PREMARIN 0.625 MG PO TABS: 0.6250 mg | ORAL_TABLET | Freq: Every day | ORAL | 3 refills | Status: DC

## 2017-10-28 MED ORDER — SERTRALINE HCL 50 MG PO TABS: 100.0000 mg | ORAL_TABLET | Freq: Every day | ORAL | 3 refills | Status: DC

## 2017-10-28 MED ORDER — VARENICLINE TARTRATE 0.5 MG X 11 & 1 MG X 42 PO MISC: ORAL | 0 refills | Status: DC
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
## 2017-10-29 LAB — DRUG ABUSE PANEL 10-50 NO CONF, U
AMPHETAMINES (1000 ng/mL SCRN): NEGATIVE
BARBITURATES: NEGATIVE
BENZODIAZEPINES: NEGATIVE
COCAINE METABOLITES: NEGATIVE
MARIJUANA MET (50 NG/ML SCRN): NEGATIVE
METHADONE: NEGATIVE
METHAQUALONE: NEGATIVE
OPIATES: POSITIVE — AB
PHENCYCLIDINE: NEGATIVE
PROPOXYPHENE: NEGATIVE

## 2017-11-29 ENCOUNTER — Other Ambulatory Visit: Payer: Self-pay

## 2017-11-29 DIAGNOSIS — M797 Fibromyalgia: Secondary | ICD-10-CM

## 2017-11-29 MED ORDER — HYDROCODONE-IBUPROFEN 7.5-200 MG PO TABS: 1.0000 | ORAL_TABLET | Freq: Two times a day (BID) | ORAL | 0 refills | Status: DC | PRN

## 2017-11-29 MED ORDER — TRAMADOL HCL 50 MG PO TABS: 50.0000 mg | ORAL_TABLET | Freq: Three times a day (TID) | ORAL | 0 refills | Status: DC | PRN

## 2017-11-30 ENCOUNTER — Telehealth: Payer: Self-pay

## 2017-11-30 NOTE — Telephone Encounter (Signed)
Lori MessierKathy called because she states she takes Premarin daily without stopping. She states the new prescription states to stop for 7 days. Please advise.

## 2017-11-30 NOTE — Telephone Encounter (Signed)
Ok to change back to once a day. Not sure why this was changed?  I have not idea.

## 2017-12-01 MED ORDER — PREMARIN 0.625 MG PO TABS: 0.6250 mg | ORAL_TABLET | Freq: Every day | ORAL | 1 refills | Status: DC

## 2017-12-01 NOTE — Telephone Encounter (Signed)
Medication has been sent with corrected directions. Left message advising patient.

## 2017-12-26 ENCOUNTER — Other Ambulatory Visit: Payer: Self-pay

## 2017-12-26 DIAGNOSIS — M797 Fibromyalgia: Secondary | ICD-10-CM

## 2017-12-26 MED ORDER — TRAMADOL HCL 50 MG PO TABS: 50.0000 mg | ORAL_TABLET | Freq: Three times a day (TID) | ORAL | 0 refills | Status: DC | PRN

## 2017-12-26 MED ORDER — HYDROCODONE-IBUPROFEN 7.5-200 MG PO TABS: 1.0000 | ORAL_TABLET | Freq: Two times a day (BID) | ORAL | 0 refills | Status: DC | PRN

## 2017-12-26 NOTE — Telephone Encounter (Signed)
Lori [name] requests refill on Tramadol and Hydrocodone.

## 2018-01-26 ENCOUNTER — Encounter: Payer: Self-pay | Admitting: Family Medicine

## 2018-01-26 ENCOUNTER — Ambulatory Visit: Payer: BLUE CROSS/BLUE SHIELD | Admitting: Family Medicine

## 2018-01-26 VITALS — BP 111/68 | HR 103 | Ht 68.0 in | Wt 153.0 lb

## 2018-01-26 DIAGNOSIS — Z720 Tobacco use: Secondary | ICD-10-CM

## 2018-01-26 DIAGNOSIS — M797 Fibromyalgia: Secondary | ICD-10-CM | POA: Diagnosis not present

## 2018-01-26 DIAGNOSIS — Z23 Encounter for immunization: Secondary | ICD-10-CM

## 2018-01-26 DIAGNOSIS — E7800 Pure hypercholesterolemia, unspecified: Secondary | ICD-10-CM | POA: Diagnosis not present

## 2018-01-26 DIAGNOSIS — G8929 Other chronic pain: Secondary | ICD-10-CM

## 2018-01-26 DIAGNOSIS — M79672 Pain in left foot: Secondary | ICD-10-CM

## 2018-01-26 MED ORDER — VARENICLINE TARTRATE 0.5 MG X 11 & 1 MG X 42 PO MISC: ORAL | 0 refills | Status: DC

## 2018-01-26 MED ORDER — TRAMADOL HCL 50 MG PO TABS: 50.0000 mg | ORAL_TABLET | Freq: Three times a day (TID) | ORAL | 0 refills | Status: DC | PRN

## 2018-01-26 MED ORDER — HYDROCODONE-IBUPROFEN 7.5-200 MG PO TABS: 1.0000 | ORAL_TABLET | Freq: Two times a day (BID) | ORAL | 0 refills | Status: DC | PRN

## 2018-01-26 NOTE — Progress Notes (Signed)
Subjective:    CC: F/U Pain management.    HPI:  57 year old female here today for follow-up for chronic pain management/fibromyalgia-as follow-up was 3 months ago.  She is not currently exercising.   Tobacco abuse-she would like to restart the Chantix again. She started it but wasn't in the right frame of mind. She is better ready to start.   She also complains of left foot pain intermittently.  She said she broke that foot about a year ago.  She says it started to bother her so she really backed off on her activity.  Is actually been feeling better the last several weeks but just feels like she is lazy and has not gotten back in 2 any type of exercise.  She is has not felt motivated but denies feeling down or depressed.  Hyperlipidemia-tolerating pravastatin well.  Due to recheck lipid panel. Lab Results  Component Value Date   CHOL 247 (H) 12/28/2016   HDL 39 (L) 12/28/2016   LDLCALC NOT CALC 10/24/2015   LDLDIRECT 172 (H) 10/24/2015   TRIG 267 (H) 12/28/2016   CHOLHDL 6.3 (H) 12/28/2016     Past medical history, Surgical history, Family history not pertinant except as noted below, Social history, Allergies, and medications have been entered into the medical record, reviewed, and corrections made.   Review of Systems: No fevers, chills, night sweats, weight loss, chest pain, or shortness of breath.   Objective:    General: Well Developed, well nourished, and in no acute distress.  Neuro: Alert and oriented x3, extra-ocular muscles intact, sensation grossly intact.  HEENT: Normocephalic, atraumatic  Skin: Warm and dry, no rashes. Cardiac: Regular rate and rhythm, no murmurs rubs or gallops, no lower extremity edema.  Respiratory: Clear to auscultation bilaterally. Not using accessory muscles, speaking in full sentences.   Impression and Recommendations:   Chronic pain management/fibromyalgia-refilled tramadol and hydrocodone today.  Urine drug screen is up-to-date.  Up in 3  months.  We went to update her pain contract at that point in time.  Tob abuse - will restart Chantix.  New prescription sent.  Left foot pain -right now it is actually feeling better so just encouraged her to start her walking routine again.  X provided her of the importance of graded exercise and controlling her fibromyalgia pain.  She might actually benefit from orthotics if the pain continues to return encouraged her to follow with 1 of our sports medicine providers.  Hyperlipidemia-due to recheck lipid panel.  Tdap given today.

## 2018-01-27 ENCOUNTER — Telehealth: Payer: Self-pay | Admitting: Family Medicine

## 2018-01-27 DIAGNOSIS — M797 Fibromyalgia: Secondary | ICD-10-CM

## 2018-01-27 LAB — DRUG ABUSE PANEL 10-50 NO CONF, U
AMPHETAMINES (1000 ng/mL SCRN): NEGATIVE
BARBITURATES: NEGATIVE
BENZODIAZEPINES: NEGATIVE
COCAINE METABOLITES: NEGATIVE
MARIJUANA MET (50 ng/mL SCRN): NEGATIVE
METHADONE: NEGATIVE
METHAQUALONE: NEGATIVE
OPIATES: NEGATIVE
PHENCYCLIDINE: NEGATIVE
PROPOXYPHENE: NEGATIVE

## 2018-01-27 NOTE — Telephone Encounter (Signed)
Pt advised, verbalized understanding. She will get all 3 from there, so no need to change Chantix Rx. No further questions.

## 2018-01-27 NOTE — Telephone Encounter (Signed)
[facility] [place] but the others have already been [facility] in regards to the pain medications, yesterday.  She will have to pick them up there.  She keeps bouncing back and forth between which pharmacy she wants to use and she cannot do that and we have I reiterated that in the past.

## 2018-01-27 NOTE — Telephone Encounter (Signed)
Pt called clinic requesting her hydrocodone, tramadol, chantix [phone] - [place], [place] - [address] [facility]. [facility]. Will pend.

## 2018-02-23 ENCOUNTER — Other Ambulatory Visit: Payer: Self-pay

## 2018-02-23 DIAGNOSIS — M797 Fibromyalgia: Secondary | ICD-10-CM

## 2018-02-23 MED ORDER — HYDROCODONE-IBUPROFEN 7.5-200 MG PO TABS: 1.0000 | ORAL_TABLET | Freq: Two times a day (BID) | ORAL | 0 refills | Status: DC | PRN

## 2018-02-23 MED ORDER — TRAMADOL HCL 50 MG PO TABS: 50.0000 mg | ORAL_TABLET | Freq: Three times a day (TID) | ORAL | 0 refills | Status: DC | PRN

## 2018-02-23 NOTE — Telephone Encounter (Signed)
Lori [name] requests a refill on Hydrocodone and Tramadol.  

## 2018-03-23 ENCOUNTER — Other Ambulatory Visit: Payer: Self-pay | Admitting: Family Medicine

## 2018-03-23 ENCOUNTER — Other Ambulatory Visit: Payer: Self-pay

## 2018-03-23 DIAGNOSIS — M797 Fibromyalgia: Secondary | ICD-10-CM

## 2018-03-23 MED ORDER — HYDROCODONE-IBUPROFEN 7.5-200 MG PO TABS: 1.0000 | ORAL_TABLET | Freq: Two times a day (BID) | ORAL | 0 refills | Status: DC | PRN

## 2018-03-23 MED ORDER — TRAMADOL HCL 50 MG PO TABS: 50.0000 mg | ORAL_TABLET | Freq: Three times a day (TID) | ORAL | 0 refills | Status: DC | PRN

## 2018-03-23 NOTE — Telephone Encounter (Signed)
Hasn't had this med since 2018

## 2018-03-23 NOTE — Telephone Encounter (Signed)
Lori [name] called for a refill on Hydrocodone and Tramadol.

## 2018-04-20 ENCOUNTER — Other Ambulatory Visit: Payer: Self-pay | Admitting: Family Medicine

## 2018-04-20 DIAGNOSIS — M797 Fibromyalgia: Secondary | ICD-10-CM

## 2018-04-20 MED ORDER — HYDROCODONE-IBUPROFEN 7.5-200 MG PO TABS: 1.0000 | ORAL_TABLET | Freq: Two times a day (BID) | ORAL | 0 refills | Status: DC | PRN

## 2018-04-20 MED ORDER — TRAMADOL HCL 50 MG PO TABS: 50.0000 mg | ORAL_TABLET | Freq: Three times a day (TID) | ORAL | 0 refills | Status: DC | PRN

## 2018-04-20 NOTE — Telephone Encounter (Signed)
Looked up pt on [place] database. Her last refill of Vicoprofin was 03/23/18 #30, and tramadol was 03/23/18 #90. Sent to pcp for review and signature.Heath GoldBarkley, Sham Alviar [name], CMA

## 2018-05-05 ENCOUNTER — Telehealth: Payer: Self-pay | Admitting: Family Medicine

## 2018-05-05 NOTE — Telephone Encounter (Signed)
If she can only come after July 23 then why dont we have her come in before she runs out to see her PCP?

## 2018-05-05 NOTE — Telephone Encounter (Signed)
Def, and feel free to put it on me.  Also, if someone needs an urgent appointment and cant get in because of this just double book them on my schedule!  Thank you!

## 2018-05-05 NOTE — Telephone Encounter (Signed)
Thank you :)

## 2018-05-05 NOTE — Telephone Encounter (Signed)
PCP only has acute slot openings. Should we fill one of those?

## 2018-05-05 NOTE — Telephone Encounter (Signed)
Pt called. She had to cancel her appointment for 7/15 because she has to take care of her Mom. She will run out of her Tramadol and Hydrocodone between July 28th & 30th. She is aware that she has to be incompliance with her pain contract but this situation could not be helped. She has rescheduled her appointment to August 8th(she could only come in after July 23rd)

## 2018-05-05 NOTE — Telephone Encounter (Signed)
Left message advising of recommendations.  

## 2018-05-05 NOTE — Telephone Encounter (Signed)
Routing to covering Provider for review.  

## 2018-05-08 ENCOUNTER — Ambulatory Visit: Payer: BLUE CROSS/BLUE SHIELD | Admitting: Family Medicine

## 2018-05-17 ENCOUNTER — Other Ambulatory Visit (INDEPENDENT_AMBULATORY_CARE_PROVIDER_SITE_OTHER): Payer: BLUE CROSS/BLUE SHIELD | Admitting: *Deleted

## 2018-05-17 DIAGNOSIS — Z1211 Encounter for screening for malignant neoplasm of colon: Secondary | ICD-10-CM

## 2018-05-17 LAB — POC HEMOCCULT BLD/STL (HOME/3-CARD/SCREEN)
Card #2 Fecal Occult Blod, POC: NEGATIVE
FECAL OCCULT BLD: NEGATIVE
Fecal Occult Blood, POC: NEGATIVE

## 2018-05-18 ENCOUNTER — Telehealth: Payer: Self-pay

## 2018-05-18 DIAGNOSIS — M797 Fibromyalgia: Secondary | ICD-10-CM

## 2018-05-18 MED ORDER — HYDROCODONE-IBUPROFEN 7.5-200 MG PO TABS: 1.0000 | ORAL_TABLET | Freq: Two times a day (BID) | ORAL | 0 refills | Status: DC | PRN

## 2018-05-18 NOTE — Telephone Encounter (Signed)
Is was clearly stated on her check out sheet. She needs to be seen every 3 months for her narcotics and this hasn't changed.  She needs up to date pain contract.  I am only sending a 15 day supply.

## 2018-05-18 NOTE — Telephone Encounter (Signed)
Olegario MessierKathy has a scheduled follow up on 06/01/2018. She would like a refill on Hydrocodone and Tramadol. She states she was never advised to schedule a follow up in July. Please advise.

## 2018-05-19 ENCOUNTER — Other Ambulatory Visit: Payer: Self-pay | Admitting: Family Medicine

## 2018-05-19 NOTE — Telephone Encounter (Signed)
Left VM for Pt advising that short term Rx sent and that she must keep appt

## 2018-06-01 ENCOUNTER — Encounter: Payer: Self-pay | Admitting: Family Medicine

## 2018-06-01 ENCOUNTER — Ambulatory Visit (INDEPENDENT_AMBULATORY_CARE_PROVIDER_SITE_OTHER): Payer: BLUE CROSS/BLUE SHIELD | Admitting: Family Medicine

## 2018-06-01 VITALS — BP 123/62 | HR 92 | Ht 68.0 in | Wt 153.0 lb

## 2018-06-01 DIAGNOSIS — I10 Essential (primary) hypertension: Secondary | ICD-10-CM

## 2018-06-01 DIAGNOSIS — M797 Fibromyalgia: Secondary | ICD-10-CM

## 2018-06-01 DIAGNOSIS — R21 Rash and other nonspecific skin eruption: Secondary | ICD-10-CM

## 2018-06-01 DIAGNOSIS — G8929 Other chronic pain: Secondary | ICD-10-CM | POA: Diagnosis not present

## 2018-06-01 LAB — COMPLETE METABOLIC PANEL WITH GFR
AG RATIO: 1.5 (calc) (ref 1.0–2.5)
ALT: 18 U/L (ref 6–29)
AST: 17 U/L (ref 10–35)
Albumin: 4.3 g/dL (ref 3.6–5.1)
Alkaline phosphatase (APISO): 72 U/L (ref 33–130)
BUN: 11 mg/dL (ref 7–25)
CALCIUM: 10 mg/dL (ref 8.6–10.4)
CO2: 26 mmol/L (ref 20–32)
CREATININE: 0.65 mg/dL (ref 0.50–1.05)
Chloride: 104 mmol/L (ref 98–110)
GFR, EST AFRICAN AMERICAN: 114 mL/min/{1.73_m2} (ref >=60)
GFR, EST NON AFRICAN AMERICAN: 99 mL/min/{1.73_m2} (ref >=60)
GLOBULIN: 2.8 g/dL (ref 1.9–3.7)
Glucose, Bld: 92 mg/dL (ref 65–99)
POTASSIUM: 4.2 mmol/L (ref 3.5–5.3)
SODIUM: 138 mmol/L (ref 135–146)
TOTAL PROTEIN: 7.1 g/dL (ref 6.1–8.1)
Total Bilirubin: 0.3 mg/dL (ref 0.2–1.2)

## 2018-06-01 LAB — CBC
HCT: 38.8 % (ref 35.0–45.0)
HEMOGLOBIN: 13.3 g/dL (ref 11.7–15.5)
MCH: 28.7 pg (ref 27.0–33.0)
MCHC: 34.3 g/dL (ref 32.0–36.0)
MCV: 83.6 fL (ref 80.0–100.0)
MPV: 11.2 fL (ref 7.5–12.5)
PLATELETS: 256 10*3/uL (ref 140–400)
RBC: 4.64 10*6/uL (ref 3.80–5.10)
RDW: 12.8 % (ref 11.0–15.0)
WBC: 8.5 10*3/uL (ref 3.8–10.8)

## 2018-06-01 LAB — LIPID PANEL
CHOL/HDL RATIO: 6.2 (calc) — AB (ref <5.0)
Cholesterol: 280 mg/dL — ABNORMAL HIGH (ref <200)
HDL: 45 mg/dL — AB (ref >50)
NON-HDL CHOLESTEROL (CALC): 235 mg/dL — AB (ref <130)
Triglycerides: 592 mg/dL — ABNORMAL HIGH (ref <150)

## 2018-06-01 MED ORDER — FLUCONAZOLE 150 MG PO TABS: 150.0000 mg | ORAL_TABLET | ORAL | 0 refills | Status: DC

## 2018-06-01 MED ORDER — HYDROCODONE-IBUPROFEN 7.5-200 MG PO TABS: 1.0000 | ORAL_TABLET | Freq: Two times a day (BID) | ORAL | 0 refills | Status: DC | PRN
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
## 2018-06-01 NOTE — Patient Instructions (Signed)
If your rash is not better after 10 days then please come back and so that we can reexamine the area and try different treatment if needed.

## 2018-06-02 ENCOUNTER — Encounter: Payer: Self-pay | Admitting: Family Medicine

## 2018-06-04 LAB — PAIN MGMT, OPIATES EXP. QN, UR
Codeine: NEGATIVE ng/mL (ref <50)
Hydrocodone: 411 ng/mL — ABNORMAL HIGH (ref <50)
Hydromorphone: NEGATIVE ng/mL (ref <50)
Morphine: NEGATIVE ng/mL (ref <50)
NOROXYCODONE: NEGATIVE ng/mL (ref <50)
Norhydrocodone: 564 ng/mL — ABNORMAL HIGH (ref <50)
OXYCODONE: NEGATIVE ng/mL (ref <50)
OXYMORPHONE: NEGATIVE ng/mL (ref <50)

## 2018-06-16 ENCOUNTER — Telehealth: Payer: Self-pay | Admitting: Family Medicine

## 2018-06-16 MED ORDER — TRAMADOL HCL 50 MG PO TABS: ORAL_TABLET | ORAL | 0 refills | Status: DC

## 2018-06-16 NOTE — Telephone Encounter (Signed)
Med refilled.

## 2018-06-16 NOTE — Telephone Encounter (Signed)
Patient calls and request a refill on her Tramadol?

## 2018-06-29 ENCOUNTER — Other Ambulatory Visit: Payer: Self-pay

## 2018-06-29 DIAGNOSIS — M797 Fibromyalgia: Secondary | ICD-10-CM

## 2018-06-29 MED ORDER — HYDROCODONE-IBUPROFEN 7.5-200 MG PO TABS: 1.0000 | ORAL_TABLET | Freq: Two times a day (BID) | ORAL | 0 refills | Status: DC | PRN

## 2018-06-29 NOTE — Telephone Encounter (Signed)
Lori [name] requests a refill on Hydrocodone.  

## 2018-07-14 ENCOUNTER — Other Ambulatory Visit: Payer: Self-pay

## 2018-07-14 MED ORDER — TRAMADOL HCL 50 MG PO TABS: ORAL_TABLET | ORAL | 0 refills | Status: DC

## 2018-07-14 NOTE — Telephone Encounter (Signed)
Requesting RF on Tramadol  Last written 06-16-18 for quantity 90  Dr Linford ArnoldMetheney out of office.  RX pended, please review and send if appropriate

## 2018-07-28 ENCOUNTER — Other Ambulatory Visit: Payer: Self-pay

## 2018-07-28 DIAGNOSIS — M797 Fibromyalgia: Secondary | ICD-10-CM

## 2018-07-28 MED ORDER — HYDROCODONE-IBUPROFEN 7.5-200 MG PO TABS: 1.0000 | ORAL_TABLET | Freq: Two times a day (BID) | ORAL | 0 refills | Status: DC | PRN

## 2018-07-28 NOTE — Telephone Encounter (Signed)
Request for a refill on Hydrocodone.  

## 2018-08-11 ENCOUNTER — Other Ambulatory Visit: Payer: Self-pay | Admitting: Physician Assistant

## 2018-08-11 NOTE — Telephone Encounter (Signed)
Patient requested a refill of Tramadol. Follow up scheduled for 08/28/2018.

## 2018-08-28 ENCOUNTER — Ambulatory Visit (INDEPENDENT_AMBULATORY_CARE_PROVIDER_SITE_OTHER): Payer: BLUE CROSS/BLUE SHIELD | Admitting: Family Medicine

## 2018-08-28 ENCOUNTER — Telehealth: Payer: Self-pay

## 2018-08-28 ENCOUNTER — Telehealth: Payer: Self-pay | Admitting: Family Medicine

## 2018-08-28 ENCOUNTER — Encounter: Payer: Self-pay | Admitting: Family Medicine

## 2018-08-28 VITALS — BP 134/69 | HR 81 | Ht 65.39 in | Wt 152.0 lb

## 2018-08-28 DIAGNOSIS — F17200 Nicotine dependence, unspecified, uncomplicated: Secondary | ICD-10-CM

## 2018-08-28 DIAGNOSIS — G8929 Other chronic pain: Secondary | ICD-10-CM | POA: Diagnosis not present

## 2018-08-28 DIAGNOSIS — M797 Fibromyalgia: Secondary | ICD-10-CM

## 2018-08-28 DIAGNOSIS — R21 Rash and other nonspecific skin eruption: Secondary | ICD-10-CM | POA: Diagnosis not present

## 2018-08-28 DIAGNOSIS — I10 Essential (primary) hypertension: Secondary | ICD-10-CM

## 2018-08-28 MED ORDER — TRAMADOL HCL 50 MG PO TABS: ORAL_TABLET | ORAL | 0 refills | Status: DC

## 2018-08-28 MED ORDER — CHANTIX STARTING MONTH PAK 0.5 MG X 11 & 1 MG X 42 PO TABS: ORAL_TABLET | ORAL | 0 refills | Status: DC

## 2018-08-28 MED ORDER — HYDROCODONE-IBUPROFEN 7.5-200 MG PO TABS: 1.0000 | ORAL_TABLET | Freq: Two times a day (BID) | ORAL | 0 refills | Status: DC | PRN

## 2018-08-28 MED ORDER — LISINOPRIL 10 MG PO TABS: 10.0000 mg | ORAL_TABLET | Freq: Every day | ORAL | 1 refills | Status: DC

## 2018-08-28 MED ORDER — VALACYCLOVIR HCL 1 G PO TABS: 1000.0000 mg | ORAL_TABLET | Freq: Two times a day (BID) | ORAL | 0 refills | Status: DC

## 2018-08-28 MED ORDER — PREMARIN 0.625 MG PO TABS: 0.6250 mg | ORAL_TABLET | Freq: Every day | ORAL | 1 refills | Status: DC

## 2018-08-28 NOTE — Telephone Encounter (Signed)
[facility].   I called and [facility] who states that [facility] could [facility] but it would not be here until Thursday and pt is completely out of medication. [facility]ing [facility] [address] [facility]. RX pended and previous RX [facility] was deleted by Nicholos Johns.
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
## 2018-08-28 NOTE — Telephone Encounter (Signed)
Per Dr. Linford Arnold [facility] [place] [facility] [place]. [facility] [place] and spoke with [facility] Batten [facility] [place] and she will cancel the Rx. Dr. Judie Petit will send a new rx to a different pharmacy.

## 2018-08-29 MED ORDER — HYDROCODONE-IBUPROFEN 7.5-200 MG PO TABS: 1.0000 | ORAL_TABLET | Freq: Two times a day (BID) | ORAL | 0 refills | Status: DC | PRN

## 2018-08-29 NOTE — Telephone Encounter (Signed)
OK, rx sent.

## 2018-08-29 NOTE — Telephone Encounter (Signed)
Ryla advised.

## 2018-09-27 ENCOUNTER — Other Ambulatory Visit: Payer: Self-pay

## 2018-09-27 DIAGNOSIS — M797 Fibromyalgia: Secondary | ICD-10-CM

## 2018-09-27 MED ORDER — HYDROCODONE-IBUPROFEN 7.5-200 MG PO TABS: 1.0000 | ORAL_TABLET | Freq: Two times a day (BID) | ORAL | 0 refills | Status: DC | PRN

## 2018-09-27 NOTE — Telephone Encounter (Signed)
[place] requests a refill on Hydrocodone.  

## 2018-10-01 ENCOUNTER — Other Ambulatory Visit: Payer: Self-pay | Admitting: Family Medicine

## 2018-10-06 ENCOUNTER — Other Ambulatory Visit: Payer: Self-pay

## 2018-10-06 MED ORDER — TRAMADOL HCL 50 MG PO TABS: ORAL_TABLET | ORAL | 0 refills | Status: DC

## 2018-10-06 NOTE — Telephone Encounter (Signed)
Patient requests a refill on Tramadol.  

## 2018-10-27 ENCOUNTER — Ambulatory Visit (INDEPENDENT_AMBULATORY_CARE_PROVIDER_SITE_OTHER): Payer: BLUE CROSS/BLUE SHIELD | Admitting: Family Medicine

## 2018-10-27 ENCOUNTER — Encounter: Payer: Self-pay | Admitting: Family Medicine

## 2018-10-27 VITALS — BP 132/78 | HR 100 | Ht 65.0 in | Wt 148.0 lb

## 2018-10-27 DIAGNOSIS — R21 Rash and other nonspecific skin eruption: Secondary | ICD-10-CM | POA: Diagnosis not present

## 2018-10-27 DIAGNOSIS — G8929 Other chronic pain: Secondary | ICD-10-CM

## 2018-10-27 DIAGNOSIS — M797 Fibromyalgia: Secondary | ICD-10-CM

## 2018-10-27 DIAGNOSIS — F3341 Major depressive disorder, recurrent, in partial remission: Secondary | ICD-10-CM | POA: Diagnosis not present

## 2018-10-27 MED ORDER — TRAMADOL HCL 50 MG PO TABS: ORAL_TABLET | ORAL | 0 refills | Status: DC

## 2018-10-27 MED ORDER — HYDROCODONE-IBUPROFEN 7.5-200 MG PO TABS: 1.0000 | ORAL_TABLET | Freq: Two times a day (BID) | ORAL | 0 refills | Status: DC | PRN

## 2018-10-27 MED ORDER — VALACYCLOVIR HCL 1 G PO TABS: 1000.0000 mg | ORAL_TABLET | Freq: Two times a day (BID) | ORAL | 1 refills | Status: DC | PRN

## 2018-10-27 NOTE — Progress Notes (Signed)
Subjective:    CC: Chronic pain management, 32-month follow-up.  HPI:  58 year old female is here today for follow-up for chronic pain management.  She is currently on 40 hydrocodone per month and 90 tramadol per month to help control her pain for her fibromyalgia.  She has been stable on this regimen for quite some time.  She actually has been doing really well overall.  No recent flares or exacerbations.  He has been sleeping okay.  She would like to have her [facility] that she is here with her mother for the next week she is been taking her mother to doctor's appointments.  Major depressive disorder-overall she is actually doing really well.  She is been taking his sertraline and tolerating it well.  She says she is staying with her mom right now and helps to take care of her from time to time and that can be a little bit stressful but otherwise is doing okay.  Did come in complaining of some skin irritation around the groin area when I last saw her in November.  We did not do an exam as she had declined.  But we decided to go ahead and try a trial treatment with valacyclovir she said that did seem to make a big difference and really helped.  Past medical history, Surgical history, Family history not pertinant except as noted below, Social history, Allergies, and medications have been entered into the medical record, reviewed, and corrections made.   Review of Systems: No fevers, chills, night sweats, weight loss, chest pain, or shortness of breath.   Objective:    General: Well Developed, well nourished, and in no acute distress.  Neuro: Alert and oriented x3, extra-ocular muscles intact, sensation grossly intact.  HEENT: Normocephalic, atraumatic  Skin: Warm and dry, no rashes. Cardiac: Regular rate and rhythm, no murmurs rubs or gallops, no lower extremity edema.  Respiratory: Clear to auscultation bilaterally. Not using accessory muscles, speaking in full  sentences.   Impression and Recommendations:    Chronic pain management for fibromyalgia back pain -refill her Vicodin. F/U in 3 months.    Indication for chronic opioid:Back pain and fibromyalgia Medication and dose:Vicodin # pills per month:40 Last UDS date:Performed today Opioid Treatment Agreement signed (Y/N):Y Opioid Treatment Agreement last reviewed with patient:  N[facility]SRS reviewed this encounter (include red flags):Yes   Depressive disorder-continue with sertraline.  Refills sent to pharmacy.  Given irritation in the groin area-did well with the valacyclovir she is requesting refill be sent to pharmacy today.

## 2018-10-28 LAB — PAIN MGMT, PROFILE 5 W/O MEDMATCH U
Amphetamines: NEGATIVE ng/mL (ref <500)
BENZODIAZEPINES: NEGATIVE ng/mL (ref <100)
Barbiturates: NEGATIVE ng/mL (ref <300)
Cocaine Metabolite: NEGATIVE ng/mL (ref <150)
Creatinine: 256.3 mg/dL
Marijuana Metabolite: NEGATIVE ng/mL (ref <20)
Methadone Metabolite: NEGATIVE ng/mL (ref <100)
Opiates: NEGATIVE ng/mL (ref <100)
Oxidant: NEGATIVE ug/mL (ref <200)
Oxycodone: NEGATIVE ng/mL (ref <100)
pH: 6.32 (ref 4.5–9.0)

## 2018-10-30 ENCOUNTER — Other Ambulatory Visit: Payer: Self-pay | Admitting: Family Medicine

## 2018-10-30 DIAGNOSIS — F17200 Nicotine dependence, unspecified, uncomplicated: Secondary | ICD-10-CM

## 2018-11-24 ENCOUNTER — Other Ambulatory Visit: Payer: Self-pay

## 2018-11-24 DIAGNOSIS — M797 Fibromyalgia: Secondary | ICD-10-CM

## 2018-11-24 MED ORDER — HYDROCODONE-IBUPROFEN 7.5-200 MG PO TABS: 1.0000 | ORAL_TABLET | Freq: Two times a day (BID) | ORAL | 0 refills | Status: DC | PRN

## 2018-11-24 NOTE — Telephone Encounter (Signed)
Lori [name] requests a refill of Hydrocodone. [facility] [place].

## 2018-11-27 ENCOUNTER — Other Ambulatory Visit: Payer: Self-pay | Admitting: Family Medicine

## 2018-12-01 ENCOUNTER — Other Ambulatory Visit: Payer: Self-pay

## 2018-12-01 MED ORDER — TRAMADOL HCL 50 MG PO TABS: ORAL_TABLET | ORAL | 0 refills | Status: DC

## 2018-12-01 NOTE — Telephone Encounter (Signed)
Lori [name] called for a refill on Tramadol. She will be due on 12/05/2018. I put in the start date as 12/05/2018 and pended the order.

## 2018-12-25 ENCOUNTER — Other Ambulatory Visit: Payer: Self-pay

## 2018-12-25 DIAGNOSIS — M797 Fibromyalgia: Secondary | ICD-10-CM

## 2018-12-25 MED ORDER — HYDROCODONE-IBUPROFEN 7.5-200 MG PO TABS: 1.0000 | ORAL_TABLET | Freq: Two times a day (BID) | ORAL | 0 refills | Status: DC | PRN

## 2018-12-25 MED ORDER — VALACYCLOVIR HCL 1 G PO TABS: 1000.0000 mg | ORAL_TABLET | Freq: Two times a day (BID) | ORAL | 1 refills | Status: DC | PRN

## 2018-12-25 NOTE — Telephone Encounter (Signed)
Lori [name] requests a refill on Hydrocodone.

## 2019-01-01 ENCOUNTER — Other Ambulatory Visit: Payer: Self-pay

## 2019-01-01 MED ORDER — TRAMADOL HCL 50 MG PO TABS: ORAL_TABLET | ORAL | 0 refills | Status: DC

## 2019-01-24 ENCOUNTER — Ambulatory Visit (INDEPENDENT_AMBULATORY_CARE_PROVIDER_SITE_OTHER): Payer: BLUE CROSS/BLUE SHIELD | Admitting: Family Medicine

## 2019-01-24 ENCOUNTER — Other Ambulatory Visit: Payer: Self-pay

## 2019-01-24 ENCOUNTER — Encounter: Payer: Self-pay | Admitting: Family Medicine

## 2019-01-24 VITALS — BP 130/75 | Ht 65.0 in | Wt 151.0 lb

## 2019-01-24 DIAGNOSIS — E781 Pure hyperglyceridemia: Secondary | ICD-10-CM

## 2019-01-24 DIAGNOSIS — M797 Fibromyalgia: Secondary | ICD-10-CM | POA: Diagnosis not present

## 2019-01-24 DIAGNOSIS — F3341 Major depressive disorder, recurrent, in partial remission: Secondary | ICD-10-CM

## 2019-01-24 DIAGNOSIS — G8929 Other chronic pain: Secondary | ICD-10-CM

## 2019-01-24 DIAGNOSIS — I10 Essential (primary) hypertension: Secondary | ICD-10-CM | POA: Diagnosis not present

## 2019-01-24 MED ORDER — TRAMADOL HCL 50 MG PO TABS: ORAL_TABLET | ORAL | 0 refills | Status: DC

## 2019-01-24 MED ORDER — HYDROCODONE-IBUPROFEN 7.5-200 MG PO TABS: 1.0000 | ORAL_TABLET | Freq: Two times a day (BID) | ORAL | 0 refills | Status: DC | PRN

## 2019-01-24 MED ORDER — SERTRALINE HCL 50 MG PO TABS: 100.0000 mg | ORAL_TABLET | Freq: Every day | ORAL | 1 refills | Status: DC

## 2019-01-24 NOTE — Progress Notes (Signed)
Virtual Visit via Telephone Note  I connected with Lori [name] on 01/24/19 at 10:50 AM EDT by telephone and verified that I am speaking with the correct person using two identifiers.   I discussed the limitations, risks, security and privacy concerns of performing an evaluation and management service by telephone and the availability of in person appointments. I also discussed with the patient that there may be a patient responsible charge related to this service. The patient expressed understanding and agreed to proceed.   Subjective:    CC: Fibro/Chronic Pain Management.   HPI:  F/U MDD - Currently on sertraline. She is on 100mg  daily. She takes 2 of the 45s. She is trying to work on lowering her stress.   F/U fibromyalgia - increased with her stress.  She has been doing her home stresses.  She says sleep is OK.   F/U chronic pain mgt - She is currently on 40 hydrocodone per month and 90 tramadol per month to help control her pain for her fibromyalgia.  She has been stable on this regimen for quite some time. she has had a little constipation. Occ takes a softener. That works.    Hypertension- Pt denies chest pain, SOB, dizziness, or heart palpitations.  Taking meds as directed w/o problems.  Denies medication side effects.    No fever or cough.   Past medical history, Surgical history, Family history not pertinant except as noted below, Social history, Allergies, and medications have been entered into the medical record, reviewed, and corrections made.   Review of Systems: No fevers, chills, night sweats, weight loss, chest pain, or shortness of breath.   Objective:    General: Speaking clearly in complete sentences without any shortness of breath.  Alert and oriented x3.  Normal judgment. No apparent acute distress.    Impression and Recommendations:   Chronic pain management for fibromyalgia back pain -refill her Vicodin and tramadol. F/U in 3 months.    Indication for  chronic opioid:Back pain and fibromyalgia Medication and dose:Vicodin # pills per month:40 Last UDS date:Performed today Opioid Treatment Agreement signed (Y/N):Y Opioid Treatment Agreement last reviewed with patient:  N[facility]SRS reviewed this encounter (include red flags):Yes    Major depression d/o - Stable continue current regimen. RF sent ot pharmacy.    HTN - well controlled based on home BPS. She id due for CMP but not critical so will schedule for 1-2 months out.    Elevated Triglycerides - she is taking her fish oil.  Plan to recheck this summer. Continue with statin.      I discussed the assessment and treatment plan with the patient. The patient was provided an opportunity to ask questions and all were answered. The patient agreed with the plan and demonstrated an understanding of the instructions.   The patient was advised to call back or seek an in-person evaluation if the symptoms worsen or if the condition fails to improve as anticipated.  I provided 22 minutes of non-face-to-face time during this encounter.   Nani Gasser, MD

## 2019-01-24 NOTE — Progress Notes (Signed)
Called pt to obtain her VS and medications. lvm asking her to rtn call.Heath Gold, CMA

## 2019-02-22 ENCOUNTER — Other Ambulatory Visit: Payer: Self-pay

## 2019-02-22 DIAGNOSIS — G8929 Other chronic pain: Secondary | ICD-10-CM

## 2019-02-22 DIAGNOSIS — M797 Fibromyalgia: Secondary | ICD-10-CM

## 2019-02-22 MED ORDER — TRAMADOL HCL 50 MG PO TABS: ORAL_TABLET | ORAL | 0 refills | Status: DC

## 2019-02-22 MED ORDER — HYDROCODONE-IBUPROFEN 7.5-200 MG PO TABS: 1.0000 | ORAL_TABLET | Freq: Two times a day (BID) | ORAL | 0 refills | Status: DC | PRN

## 2019-02-22 NOTE — Telephone Encounter (Signed)
Lori [name] requests a refill on Hydrocodone and Tramadol.

## 2019-03-19 IMAGING — DX DG FOOT COMPLETE 3+V*L*
3 series · 3 of 3 positions shown · non-contrast
Comparison: Plain films the left ankle 01/31/2017.

CLINICAL DATA: Status post fall down stairs a few days ago with
continued left ankle pain and swelling. Initial encounter.

EXAM:
LEFT FOOT - COMPLETE 3+ VIEW

[foot ap]
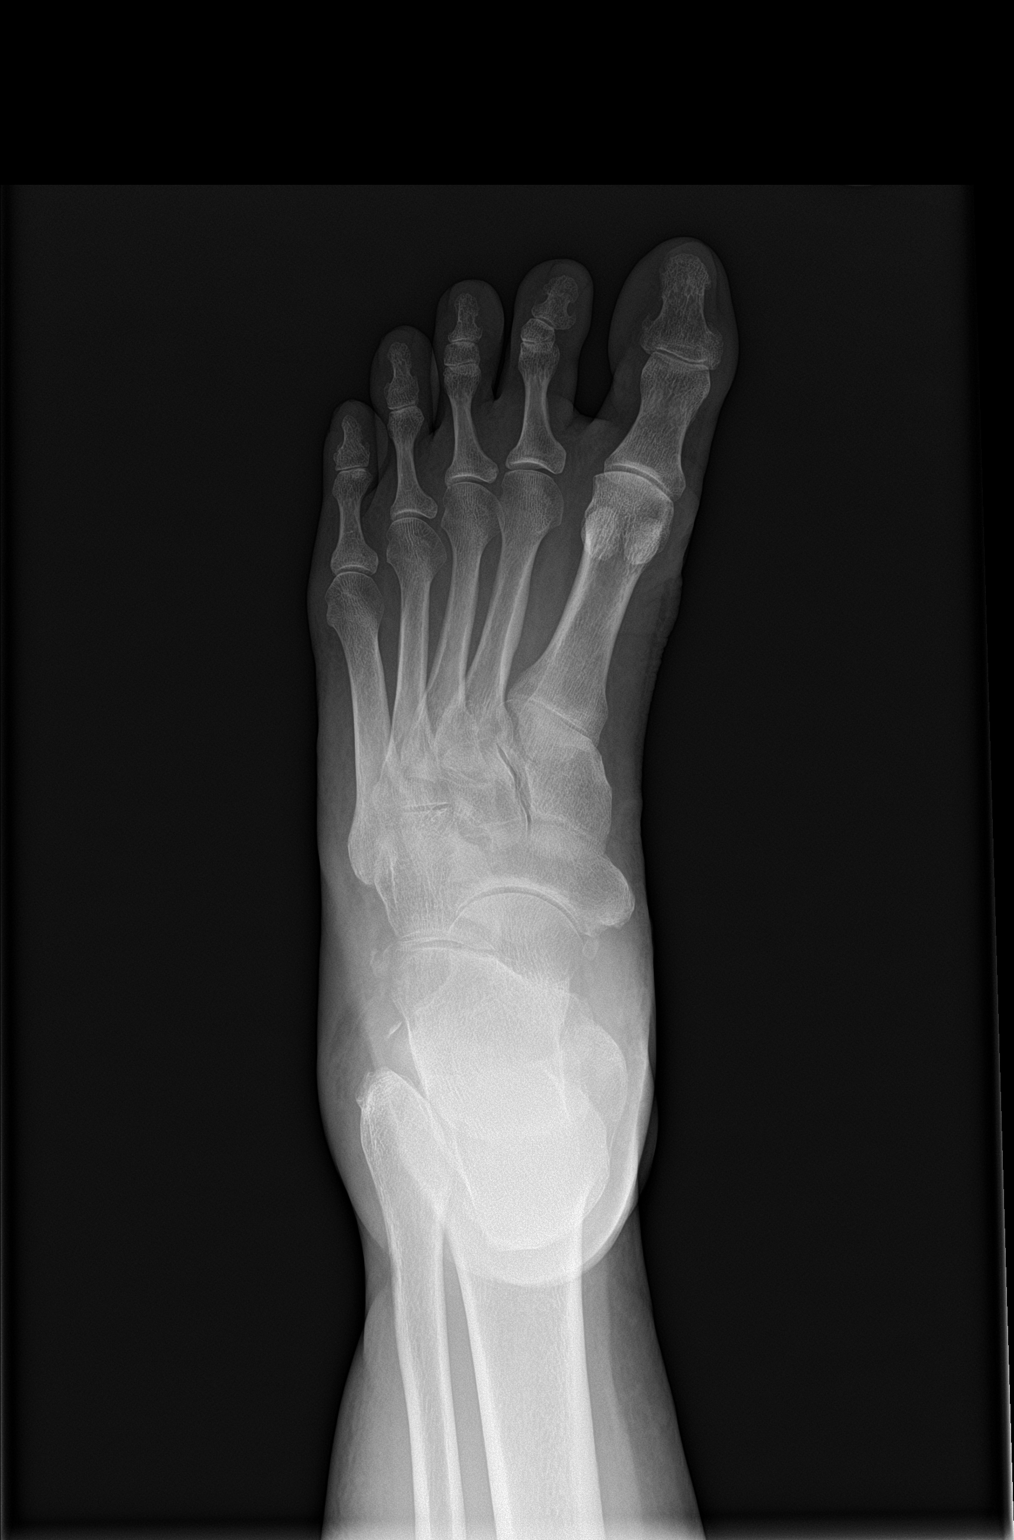

[foot obl]
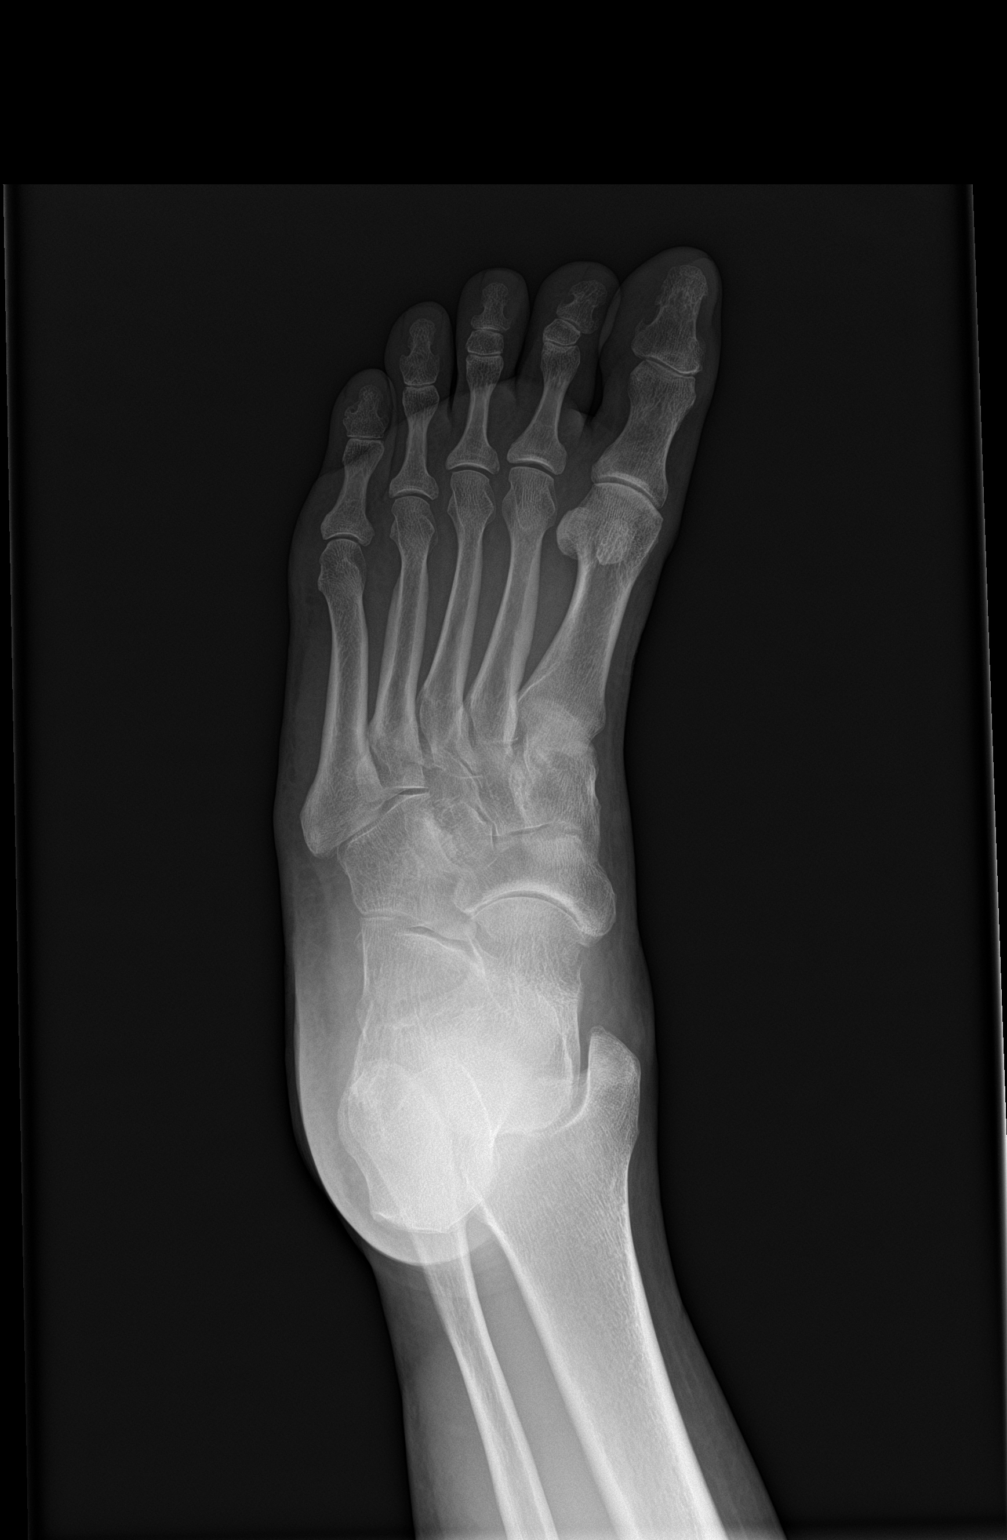

[foot lat]
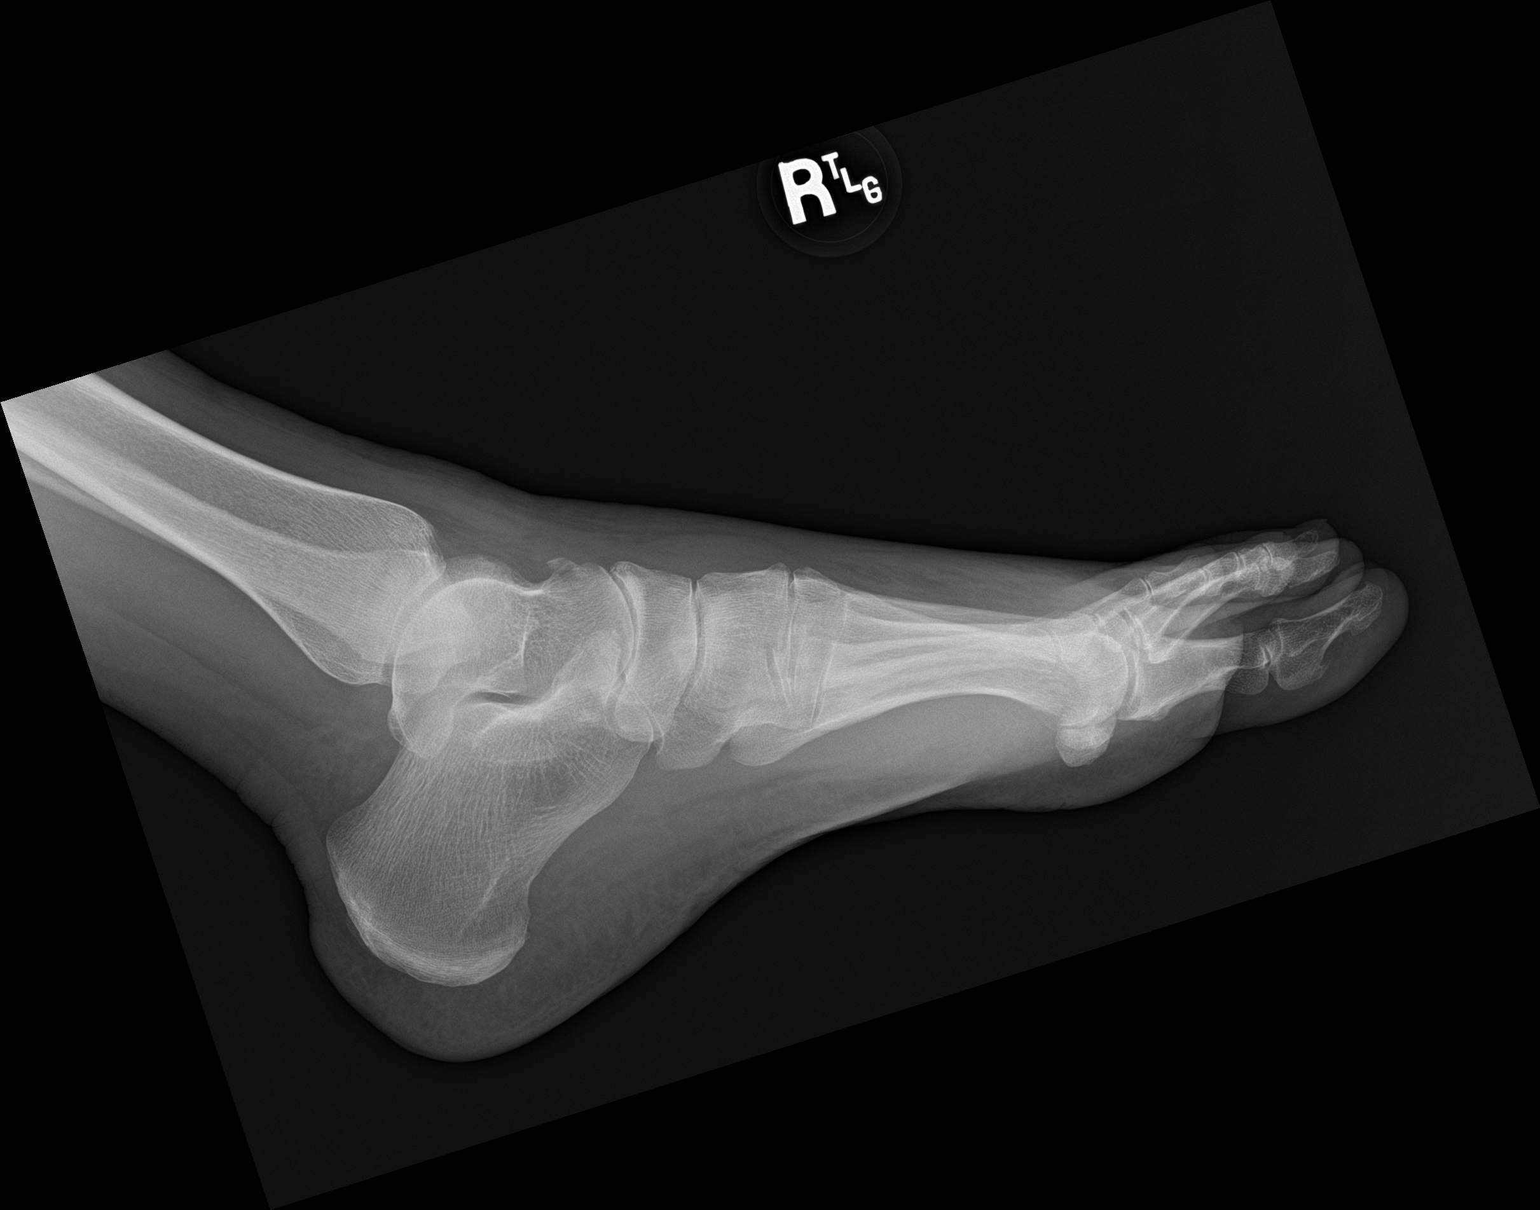

[3 of 3 positions shown; findings below may reference images not displayed]

FINDINGS: There is a fracture of the lateral margin of the distal calcaneus
just proximal to the calcaneocuboid joint. A bone fragment measuring
0.4 cm long is seen slightly posterior to the fracture site. There
is also a nondisplaced fracture of the anterior process the
calcaneus. A small avulsion fracture off the dorsal neck of the
talus is identified as seen on the comparison examination. Soft
tissues about the dorsum of the foot and lateral aspect of the ankle
are swollen.
IMPRESSION: Mildly displaced small appearing fracture of the distal calcaneus
just proximal to the calcaneocuboid joint.

Nondisplaced fracture anterior process of the calcaneus.

Chip fracture off the dorsal neck of the talus.

## 2019-03-24 ENCOUNTER — Other Ambulatory Visit: Payer: Self-pay

## 2019-03-24 ENCOUNTER — Encounter: Payer: Self-pay | Admitting: Emergency Medicine

## 2019-03-24 ENCOUNTER — Emergency Department (EMERGENCY_DEPARTMENT_HOSPITAL)
Admission: EM | Admit: 2019-03-24 | Discharge: 2019-03-26 | Disposition: A | Discharge status: Other Acute Inpatient Hospital | Payer: BLUE CROSS/BLUE SHIELD | Source: Home / Self Care | Attending: Emergency Medicine | Admitting: Emergency Medicine

## 2019-03-24 ENCOUNTER — Emergency Department
Admission: EM | Admit: 2019-03-24 | Discharge: 2019-03-24 | Disposition: A | Discharge status: Home / Self Care | Payer: BLUE CROSS/BLUE SHIELD | Attending: Emergency Medicine | Admitting: Emergency Medicine

## 2019-03-24 DIAGNOSIS — F419 Anxiety disorder, unspecified: Secondary | ICD-10-CM

## 2019-03-24 DIAGNOSIS — Z79899 Other long term (current) drug therapy: Secondary | ICD-10-CM | POA: Insufficient documentation

## 2019-03-24 DIAGNOSIS — I10 Essential (primary) hypertension: Secondary | ICD-10-CM | POA: Insufficient documentation

## 2019-03-24 DIAGNOSIS — F22 Delusional disorders: Secondary | ICD-10-CM

## 2019-03-24 DIAGNOSIS — F333 Major depressive disorder, recurrent, severe with psychotic symptoms: Secondary | ICD-10-CM | POA: Diagnosis not present

## 2019-03-24 DIAGNOSIS — F259 Schizoaffective disorder, unspecified: Secondary | ICD-10-CM | POA: Diagnosis not present

## 2019-03-24 DIAGNOSIS — F422 Mixed obsessional thoughts and acts: Secondary | ICD-10-CM | POA: Diagnosis not present

## 2019-03-24 DIAGNOSIS — F17210 Nicotine dependence, cigarettes, uncomplicated: Secondary | ICD-10-CM | POA: Insufficient documentation

## 2019-03-24 DIAGNOSIS — F429 Obsessive-compulsive disorder, unspecified: Secondary | ICD-10-CM | POA: Diagnosis present

## 2019-03-24 LAB — CBC WITH DIFFERENTIAL/PLATELET
Abs Immature Granulocytes: 0.05 10*3/uL (ref 0.00–0.07)
Basophils Absolute: 0.1 10*3/uL (ref 0.0–0.1)
Basophils Relative: 1 %
Eosinophils Absolute: 0 10*3/uL (ref 0.0–0.5)
Eosinophils Relative: 0 %
HCT: 42.7 % (ref 36.0–46.0)
Hemoglobin: 14.2 g/dL (ref 12.0–15.0)
Immature Granulocytes: 0 %
Lymphocytes Relative: 25 %
Lymphs Abs: 3.2 10*3/uL (ref 0.7–4.0)
MCH: 28.1 pg (ref 26.0–34.0)
MCHC: 33.3 g/dL (ref 30.0–36.0)
MCV: 84.6 fL (ref 80.0–100.0)
Monocytes Absolute: 0.9 10*3/uL (ref 0.1–1.0)
Monocytes Relative: 7 %
Neutro Abs: 8.6 10*3/uL — ABNORMAL HIGH (ref 1.7–7.7)
Neutrophils Relative %: 67 %
Platelets: 337 10*3/uL (ref 150–400)
RBC: 5.05 MIL/uL (ref 3.87–5.11)
RDW: 12.7 % (ref 11.5–15.5)
WBC: 12.9 10*3/uL — ABNORMAL HIGH (ref 4.0–10.5)
nRBC: 0 % (ref 0.0–0.2)

## 2019-03-24 LAB — COMPREHENSIVE METABOLIC PANEL
ALT: 24 U/L (ref 0–44)
AST: 29 U/L (ref 15–41)
Albumin: 4.4 g/dL (ref 3.5–5.0)
Alkaline Phosphatase: 66 U/L (ref 38–126)
Anion gap: 10 (ref 5–15)
BUN: 18 mg/dL (ref 6–20)
CO2: 24 mmol/L (ref 22–32)
Calcium: 10.1 mg/dL (ref 8.9–10.3)
Chloride: 107 mmol/L (ref 98–111)
Creatinine, Ser: 0.56 mg/dL (ref 0.44–1.00)
GFR calc Af Amer: >60 mL/min (ref >60)
GFR calc non Af Amer: >60 mL/min (ref >60)
Glucose, Bld: 108 mg/dL — ABNORMAL HIGH (ref 70–99)
Potassium: 3.9 mmol/L (ref 3.5–5.1)
Sodium: 141 mmol/L (ref 135–145)
Total Bilirubin: 0.5 mg/dL (ref 0.3–1.2)
Total Protein: 8 g/dL (ref 6.5–8.1)

## 2019-03-24 LAB — ETHANOL: Alcohol, Ethyl (B): <10 mg/dL (ref <10)

## 2019-03-24 MED ORDER — SERTRALINE HCL 50 MG PO TABS
100.0000 mg | ORAL_TABLET | Freq: Every day | ORAL | Status: DC
  Administered 2019-03-26: 10:00:00 100 mg via ORAL
  Filled 2019-03-24 (×3): qty 1

## 2019-03-24 MED ORDER — HALOPERIDOL LACTATE 5 MG/ML IJ SOLN: 5.0000 mg | INTRAMUSCULAR | Status: DC

## 2019-03-24 MED ORDER — NICOTINE 21 MG/24HR TD PT24: 21.0000 mg | MEDICATED_PATCH | Freq: Once | TRANSDERMAL | Status: DC

## 2019-03-24 MED ORDER — NICOTINE 14 MG/24HR TD PT24
14.0000 mg | MEDICATED_PATCH | Freq: Once | TRANSDERMAL | Status: DC
  Administered 2019-03-24: 14 mg via TRANSDERMAL

## 2019-03-24 MED ORDER — HALOPERIDOL 5 MG PO TABS: 5.0000 mg | ORAL_TABLET | ORAL | Status: DC

## 2019-03-24 NOTE — ED Notes (Signed)
Pt. Very loud and upset upon this nurse entering day room area of [facility].  Pt. Upset due to being in unit and not being allowed to have cigarettes.  Patient was finally able calm down enough to talk to this nurse.  Pt. Denies thoughts of SI or HI.  Pt. States she believes in energy fields and energy.  Pt. Has knowledge of astrology and will talk about this subject in great detail.

## 2019-03-24 NOTE — ED Notes (Signed)
Patient voices understanding of discharge instructions, Patient given the phone to call for ride, all belongings given back to patient, Patient left without signs of distress.

## 2019-03-24 NOTE — ED Notes (Signed)
Patient denies Si/hi or avh, Patient is calm and cooperative.

## 2019-03-24 NOTE — ED Notes (Signed)
Went to room to assess pt - she reports that she has a hx of anxiety and that there is not specific reason for the anxiety attack she had today - she says that she does not take any medication for anxiety and that she just "works through it" - she reports that her ex-husband brought her to the ED today and that she does not feel like she needs to be here - she is requesting to leave - advised her that she was voluntarily here and could leave if she wanted - Dr Fanny Bien is aware and pt is agreeable to stay until he can speak with her

## 2019-03-24 NOTE — Discharge Instructions (Addendum)
Follow up with your doctor and/or therapist as soon as possible regarding today's ED visit.   Please follow up any other recommendations and clinic appointments provided by the psychiatry team that saw you in the Emergency Department. ° °
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
## 2019-03-24 NOTE — BH Assessment (Signed)
TTS attempted assessment, patient was sleeping.  Requested nurse telephone when patient is awake and ready for assessment.

## 2019-03-24 NOTE — ED Notes (Signed)
Pt pacing and becoming agitated. "I want my purse and to go smoke a cigarette."  Pt demanding to to leave. "I know what this is. I came in for a headache and ankle pain."  Pt pacing the unit. EDP made aware. Maintained on 15 minute checks and observation by security camera for safety.

## 2019-03-24 NOTE — ED Notes (Signed)
Patient transferred to room 23 quad, she states she never could get a ride home, she was discharged earlier,but came right back into ED and is delusional,talking about the earth and sky and energy, she is odd in behavior, appears sleepy, denies Si/hi, will continue to monitor.

## 2019-03-24 NOTE — ED Notes (Signed)
TTS talking to patient at this time. 
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
## 2019-03-24 NOTE — ED Notes (Signed)
Pt received a dinner tray

## 2019-03-24 NOTE — ED Notes (Signed)
Patient ambulated to [facility], nurse reported to Amy Rn, Patient is cooperative and calm, no signs of distress, belongings taken with her to be locked in belongings room.

## 2019-03-24 NOTE — ED Triage Notes (Signed)
Pt with c/o anxiety, nervousness, Denies SI.
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
## 2019-03-24 NOTE — Progress Notes (Signed)
Attempted to assess the patient with little to no success as she would not awaken but for brief periods.  During these periods she kept her eyes closed and mumbled.  The only clear communication was, "I don't want to" when the opening question was asked.  Did reports she lives with her brother and sister.  Everything else could not be understood.  She was at her PCP in April and takes Zoloft 100 mg for her anxiety, restarted. Will assess again when alert.  Nanine Means, PMHNP

## 2019-03-24 NOTE — ED Triage Notes (Signed)
Patient was seen earlier for anxiety. Was discharged and has been attempting to reach ride for discharge home. Has been unable to contact anyone. Noted wandering in lobby and outside. When questioned patient states energy fields are disturbing her. Denies pain. No eye contact. Speech clear.

## 2019-03-25 DIAGNOSIS — F333 Major depressive disorder, recurrent, severe with psychotic symptoms: Secondary | ICD-10-CM | POA: Diagnosis present

## 2019-03-25 LAB — TSH: TSH: 2.022 u[IU]/mL (ref 0.350–4.500)

## 2019-03-25 MED ORDER — OLANZAPINE 10 MG IM SOLR
10.0000 mg | Freq: Once | INTRAMUSCULAR | Status: AC
  Administered 2019-03-25: 11:00:00 10 mg via INTRAMUSCULAR
  Filled 2019-03-25: qty 10

## 2019-03-25 MED ORDER — LISINOPRIL 10 MG PO TABS
10.0000 mg | ORAL_TABLET | Freq: Every day | ORAL | Status: DC
  Administered 2019-03-25 – 2019-03-26 (×2): 10 mg via ORAL
  Filled 2019-03-25 (×2): qty 2

## 2019-03-25 MED ORDER — OLANZAPINE 5 MG PO TABS
5.0000 mg | ORAL_TABLET | Freq: Every day | ORAL | Status: DC
  Administered 2019-03-26: 5 mg via ORAL
  Filled 2019-03-25: qty 1

## 2019-03-25 MED ORDER — NICOTINE 21 MG/24HR TD PT24
21.0000 mg | MEDICATED_PATCH | Freq: Every day | TRANSDERMAL | Status: DC
  Administered 2019-03-26: 11:00:00 21 mg via TRANSDERMAL
  Filled 2019-03-25: qty 1

## 2019-03-25 MED ORDER — OLANZAPINE 10 MG IM SOLR
5.0000 mg | Freq: Every day | INTRAMUSCULAR | Status: DC
  Filled 2019-03-25 (×2): qty 10

## 2019-03-25 NOTE — BH Assessment (Signed)
[facility] ED Referral Information was sent to the following:   [facility]  [facility]  [facility]  [facility]  [facility]  [facility]  [facility]  [facility]  [facility]  [facility]  [facility]  [facility] [place]
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
## 2019-03-25 NOTE — ED Notes (Signed)
Hourly rounding reveals patient in day room. No complaints, stable, in no acute distress. Q15 minute rounds and monitoring via Security Cameras to continue. 

## 2019-03-25 NOTE — ED Notes (Signed)
Hourly rounding reveals patient in room. No complaints, stable, in no acute distress. Q15 minute rounds and monitoring via Security Cameras to continue. 

## 2019-03-25 NOTE — ED Notes (Signed)
Offered patient a snack, pt only wanted ginger-ale.AS

## 2019-03-25 NOTE — ED Notes (Signed)
Pt. Moved to BHU #5 to talk to [facility] when available.

## 2019-03-25 NOTE — ED Notes (Signed)
Report to include Situation, Background, Assessment, and Recommendations received from Amy B. RN. Patient alert and oriented, warm and dry, in no acute distress. Patient denies SI, HI, AVH and pain. Patient made aware of Q15 minute rounds and security cameras for their safety. Patient instructed to come to me with needs or concerns. 

## 2019-03-25 NOTE — ED Notes (Signed)
Pt. Awake requested something to drink and was given drink.  Spoke with patient, pt. Understands why she is here and is willing to seek help from [place].  Pt. Calm and cooperative at this time.

## 2019-03-25 NOTE — ED Notes (Signed)
Hourly rounding reveals patient sleeping in room. No complaints, stable, in no acute distress. Q15 minute rounds and monitoring via Security Cameras to continue. 

## 2019-03-25 NOTE — BH Assessment (Signed)
Patient has been accepted to [facility].  She can go tomorrow 03/26/2019 any time after 10am.  Patient will receive a room assignment upon arival Accepting physician is Dr. Estill Cotta Call report to [phone].  Representative was Windell Moulding.    Please fax IVC papers to [phone].   ER Staff is aware of it:  Ronnie, ER Secretary  Dr. Darnelle Catalan, ER MD  Amy, Patient's Nurse

## 2019-03-25 NOTE — ED Notes (Signed)
Pt. Up using bathroom, requested and was given additional blanket and kleenex box.

## 2019-03-26 DIAGNOSIS — F419 Anxiety disorder, unspecified: Secondary | ICD-10-CM

## 2019-03-26 DIAGNOSIS — F422 Mixed obsessional thoughts and acts: Secondary | ICD-10-CM

## 2019-03-26 DIAGNOSIS — F333 Major depressive disorder, recurrent, severe with psychotic symptoms: Secondary | ICD-10-CM

## 2019-03-26 DIAGNOSIS — F259 Schizoaffective disorder, unspecified: Secondary | ICD-10-CM

## 2019-03-26 MED ORDER — TRAMADOL HCL 50 MG PO TABS
50.0000 mg | ORAL_TABLET | Freq: Three times a day (TID) | ORAL | Status: DC
  Administered 2019-03-26: 14:00:00 50 mg via ORAL
  Filled 2019-03-26: qty 1

## 2019-03-26 MED ORDER — ACETAMINOPHEN 500 MG PO TABS
1000.0000 mg | ORAL_TABLET | Freq: Once | ORAL | Status: AC
  Administered 2019-03-26: 1000 mg via ORAL
  Filled 2019-03-26: qty 2

## 2019-03-26 NOTE — ED Notes (Signed)
Hourly rounding reveals patient sleeping in room. No complaints, stable, in no acute distress. Q15 minute rounds and monitoring via Security Cameras to continue. 

## 2019-03-26 NOTE — ED Notes (Signed)
[facility]iff  Dept  Called  For  Transport  To [facility] 

## 2019-03-26 NOTE — ED Notes (Signed)
EMTALA reviewed by charge RN 
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
## 2019-03-26 NOTE — ED Notes (Signed)
IVC/Accepted to [facility] after 10 am  

## 2019-03-26 NOTE — ED Provider Notes (Signed)
-----------------------------------------   3:17 AM on 03/26/2019 -----------------------------------------   Blood pressure (!) 109/56, pulse 96, temperature 98.1 F (36.7 C), temperature source Oral, resp. rate 15, height 5\' 7"  (1.702 m), weight 68 kg, SpO2 98 %.  The patient is calm and cooperative at this time.  There have been no acute events since the last update.  Awaiting disposition plan from Behavioral Medicine team.    Willy Eddy, MD 03/26/19 [phone]

## 2019-04-16 ENCOUNTER — Telehealth: Payer: Self-pay

## 2019-04-16 DIAGNOSIS — G8929 Other chronic pain: Secondary | ICD-10-CM

## 2019-04-16 DIAGNOSIS — M797 Fibromyalgia: Secondary | ICD-10-CM

## 2019-04-16 MED ORDER — TRAMADOL HCL 50 MG PO TABS: ORAL_TABLET | ORAL | 0 refills | Status: DC

## 2019-04-16 MED ORDER — HYDROCODONE-IBUPROFEN 7.5-200 MG PO TABS: 1.0000 | ORAL_TABLET | Freq: Two times a day (BID) | ORAL | 0 refills | Status: DC | PRN

## 2019-04-16 NOTE — Telephone Encounter (Signed)
Okay.  I did go ahead and refill her medications for 30 days but she will definitely need to get in with a new PCP between now and then.  Please tell her that I hope she does well and I wish her the best.

## 2019-04-16 NOTE — Telephone Encounter (Signed)
Lori [name] was admitted into the hospital [facility]. She was diagnosed with bipolar. The psychiatrist, Lori Eugenio Hoes, prescribed her pain medications on her release. She states the amounts and the type of the medications are wrong. She states she can not afford to have a visit. She states due to her new insurance Lori [name] is out of network. She is going to get a new PCP in network. She wanted to know if Lori [name] could prescribe her pain medications as she has taken it in the past. He gave her Tramadol 50 mg #30 and Hydrocodone with Tylenol. She states she usually gets the Hydrocodone with ibuprofen. Please advise.   Lori. Georgette Dover. Lopez-Claros, MD Address: [address] [place], [place] [address]  Phone: [phone]   Hydrocodone-Ibuprofen 7.5-200 mg Tramadol 50 mg

## 2019-04-17 NOTE — Telephone Encounter (Signed)
Called, no answer and no VM picked up, call disconnected

## 2019-04-17 NOTE — Telephone Encounter (Signed)
Called patient and the line was disconnected.

## 2019-04-17 NOTE — Telephone Encounter (Signed)
Called, no answer, no voice mail

## 2020-04-02 LAB — HM MAMMOGRAPHY

## 2020-08-08 LAB — COLOGUARD: COLOGUARD: NEGATIVE

## 2020-08-08 LAB — EXTERNAL GENERIC LAB PROCEDURE: COLOGUARD: NEGATIVE

## 2021-01-01 ENCOUNTER — Encounter: Payer: Self-pay | Admitting: Family Medicine

## 2022-04-17 ENCOUNTER — Encounter (HOSPITAL_COMMUNITY): Payer: Self-pay | Admitting: Emergency Medicine

## 2022-04-17 ENCOUNTER — Emergency Department (HOSPITAL_COMMUNITY): Payer: BLUE CROSS/BLUE SHIELD

## 2022-04-17 ENCOUNTER — Emergency Department (HOSPITAL_COMMUNITY)
Admission: EM | Admit: 2022-04-17 | Discharge: 2022-04-18 | Disposition: A | Discharge status: Home / Self Care | Payer: BLUE CROSS/BLUE SHIELD | Attending: Emergency Medicine | Admitting: Emergency Medicine

## 2022-04-17 DIAGNOSIS — R200 Anesthesia of skin: Secondary | ICD-10-CM | POA: Insufficient documentation

## 2022-04-17 DIAGNOSIS — R6889 Other general symptoms and signs: Secondary | ICD-10-CM

## 2022-04-17 DIAGNOSIS — R000 Tachycardia, unspecified: Secondary | ICD-10-CM

## 2022-04-17 DIAGNOSIS — R69 Illness, unspecified: Secondary | ICD-10-CM | POA: Diagnosis present

## 2022-04-17 LAB — CBC WITH DIFFERENTIAL/PLATELET
Abs Immature Granulocytes: 0.08 10*3/uL — ABNORMAL HIGH (ref 0.00–0.07)
Basophils Absolute: 0.1 10*3/uL (ref 0.0–0.1)
Basophils Relative: 1 %
Eosinophils Absolute: 0 10*3/uL (ref 0.0–0.5)
Eosinophils Relative: 0 %
HCT: 46.5 % — ABNORMAL HIGH (ref 36.0–46.0)
Hemoglobin: 15.4 g/dL — ABNORMAL HIGH (ref 12.0–15.0)
Immature Granulocytes: 1 %
Lymphocytes Relative: 26 %
Lymphs Abs: 3.9 10*3/uL (ref 0.7–4.0)
MCH: 28.9 pg (ref 26.0–34.0)
MCHC: 33.1 g/dL (ref 30.0–36.0)
MCV: 87.2 fL (ref 80.0–100.0)
Monocytes Absolute: 0.7 10*3/uL (ref 0.1–1.0)
Monocytes Relative: 5 %
Neutro Abs: 10.5 10*3/uL — ABNORMAL HIGH (ref 1.7–7.7)
Neutrophils Relative %: 67 %
Platelets: 324 10*3/uL (ref 150–400)
RBC: 5.33 MIL/uL — ABNORMAL HIGH (ref 3.87–5.11)
RDW: 13 % (ref 11.5–15.5)
WBC: 15.2 10*3/uL — ABNORMAL HIGH (ref 4.0–10.5)
nRBC: 0 % (ref 0.0–0.2)

## 2022-04-17 LAB — COMPREHENSIVE METABOLIC PANEL
ALT: 28 U/L (ref 0–44)
AST: 24 U/L (ref 15–41)
Albumin: 4.3 g/dL (ref 3.5–5.0)
Alkaline Phosphatase: 74 U/L (ref 38–126)
Anion gap: 18 — ABNORMAL HIGH (ref 5–15)
BUN: 6 mg/dL — ABNORMAL LOW (ref 8–23)
CO2: 16 mmol/L — ABNORMAL LOW (ref 22–32)
Calcium: 10.7 mg/dL — ABNORMAL HIGH (ref 8.9–10.3)
Chloride: 109 mmol/L (ref 98–111)
Creatinine, Ser: 0.66 mg/dL (ref 0.44–1.00)
GFR, Estimated: >60 mL/min (ref >60)
Glucose, Bld: 100 mg/dL — ABNORMAL HIGH (ref 70–99)
Potassium: 4.1 mmol/L (ref 3.5–5.1)
Sodium: 143 mmol/L (ref 135–145)
Total Bilirubin: 0.5 mg/dL (ref 0.3–1.2)
Total Protein: 7.7 g/dL (ref 6.5–8.1)

## 2022-04-17 LAB — ETHANOL: Alcohol, Ethyl (B): <10 mg/dL (ref <10)

## 2022-04-17 LAB — TSH: TSH: 2.335 u[IU]/mL (ref 0.350–4.500)

## 2022-04-17 LAB — TROPONIN I (HIGH SENSITIVITY): Troponin I (High Sensitivity): 9 ng/L (ref <18)

## 2022-04-17 LAB — CK: Total CK: 114 U/L (ref 38–234)

## 2022-04-17 LAB — LACTIC ACID, PLASMA: Lactic Acid, Venous: 1.1 mmol/L (ref 0.5–1.9)

## 2022-04-17 MED ORDER — OLANZAPINE 5 MG PO TBDP: 10.0000 mg | ORAL_TABLET | Freq: Three times a day (TID) | ORAL | Status: DC | PRN

## 2022-04-17 MED ORDER — LORAZEPAM 2 MG/ML IJ SOLN
1.0000 mg | Freq: Once | INTRAMUSCULAR | Status: AC
  Administered 2022-04-17: 1 mg via INTRAVENOUS
  Filled 2022-04-17: qty 1

## 2022-04-17 MED ORDER — LORAZEPAM 1 MG PO TABS: 1.0000 mg | ORAL_TABLET | ORAL | Status: DC | PRN

## 2022-04-17 MED ORDER — LACTATED RINGERS IV BOLUS
1000.0000 mL | Freq: Once | INTRAVENOUS | Status: AC
  Administered 2022-04-17: 1000 mL via INTRAVENOUS

## 2022-04-17 MED ORDER — ZIPRASIDONE MESYLATE 20 MG IM SOLR: 20.0000 mg | INTRAMUSCULAR | Status: DC | PRN

## 2022-04-17 NOTE — ED Triage Notes (Signed)
Pt reports one side has been feeling "weird" for three days. Pt pulled over in the side of the road r/t these feelings.

## 2022-04-17 NOTE — BH Assessment (Signed)
TTS clinician attempted to complete assessment. Per Edgardo Roys, RN, patient received ativan and asleep, unable to participate in assessment. TTS clinician will attempt at later time.

## 2022-04-18 LAB — I-STAT VENOUS BLOOD GAS, ED
Acid-base deficit: 1 mmol/L (ref 0.0–2.0)
Bicarbonate: 21.9 mmol/L (ref 20.0–28.0)
Calcium, Ion: 1.18 mmol/L (ref 1.15–1.40)
HCT: 45 % (ref 36.0–46.0)
Hemoglobin: 15.3 g/dL — ABNORMAL HIGH (ref 12.0–15.0)
O2 Saturation: 99 %
Potassium: 3.9 mmol/L (ref 3.5–5.1)
Sodium: 140 mmol/L (ref 135–145)
TCO2: 23 mmol/L (ref 22–32)
pCO2, Ven: 32.2 mmHg — ABNORMAL LOW (ref 44–60)
pH, Ven: 7.44 — ABNORMAL HIGH (ref 7.25–7.43)
pO2, Ven: 138 mmHg — ABNORMAL HIGH (ref 32–45)

## 2022-04-18 LAB — TROPONIN I (HIGH SENSITIVITY): Troponin I (High Sensitivity): 6 ng/L (ref <18)
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
## 2023-07-02 ENCOUNTER — Other Ambulatory Visit: Payer: Self-pay

## 2023-07-02 ENCOUNTER — Emergency Department: Payer: BLUE CROSS/BLUE SHIELD

## 2023-07-02 ENCOUNTER — Emergency Department
Admission: EM | Admit: 2023-07-02 | Discharge: 2023-07-04 | Disposition: A | Discharge status: Other Acute Inpatient Hospital | Payer: BLUE CROSS/BLUE SHIELD | Attending: Emergency Medicine | Admitting: Emergency Medicine

## 2023-07-02 DIAGNOSIS — F333 Major depressive disorder, recurrent, severe with psychotic symptoms: Secondary | ICD-10-CM

## 2023-07-02 DIAGNOSIS — F22 Delusional disorders: Secondary | ICD-10-CM | POA: Insufficient documentation

## 2023-07-02 DIAGNOSIS — F17210 Nicotine dependence, cigarettes, uncomplicated: Secondary | ICD-10-CM | POA: Diagnosis not present

## 2023-07-02 DIAGNOSIS — F323 Major depressive disorder, single episode, severe with psychotic features: Secondary | ICD-10-CM | POA: Diagnosis not present

## 2023-07-02 LAB — CBC
HCT: 43 % (ref 36.0–46.0)
Hemoglobin: 14.1 g/dL (ref 12.0–15.0)
MCH: 27.7 pg (ref 26.0–34.0)
MCHC: 32.8 g/dL (ref 30.0–36.0)
MCV: 84.5 fL (ref 80.0–100.0)
Platelets: 302 10*3/uL (ref 150–400)
RBC: 5.09 MIL/uL (ref 3.87–5.11)
RDW: 12.8 % (ref 11.5–15.5)
WBC: 11.1 10*3/uL — ABNORMAL HIGH (ref 4.0–10.5)
nRBC: 0 % (ref 0.0–0.2)

## 2023-07-02 LAB — COMPREHENSIVE METABOLIC PANEL
ALT: 501 U/L — ABNORMAL HIGH (ref 0–44)
AST: 231 U/L — ABNORMAL HIGH (ref 15–41)
Albumin: 4.6 g/dL (ref 3.5–5.0)
Alkaline Phosphatase: 83 U/L (ref 38–126)
Anion gap: 12 (ref 5–15)
BUN: 11 mg/dL (ref 8–23)
CO2: 21 mmol/L — ABNORMAL LOW (ref 22–32)
Calcium: 10.1 mg/dL (ref 8.9–10.3)
Chloride: 106 mmol/L (ref 98–111)
Creatinine, Ser: 0.74 mg/dL (ref 0.44–1.00)
GFR, Estimated: >60 mL/min (ref >60)
Glucose, Bld: 122 mg/dL — ABNORMAL HIGH (ref 70–99)
Potassium: 3 mmol/L — ABNORMAL LOW (ref 3.5–5.1)
Sodium: 139 mmol/L (ref 135–145)
Total Bilirubin: 0.6 mg/dL (ref 0.3–1.2)
Total Protein: 8 g/dL (ref 6.5–8.1)

## 2023-07-02 LAB — ACETAMINOPHEN LEVEL: Acetaminophen (Tylenol), Serum: <10 ug/mL — ABNORMAL LOW (ref 10–30)

## 2023-07-02 LAB — ETHANOL: Alcohol, Ethyl (B): <10 mg/dL (ref <10)

## 2023-07-02 LAB — SALICYLATE LEVEL: Salicylate Lvl: <7 mg/dL — ABNORMAL LOW (ref 7.0–30.0)

## 2023-07-02 MED ORDER — HALOPERIDOL LACTATE 5 MG/ML IJ SOLN: 5.0000 mg | Freq: Four times a day (QID) | INTRAMUSCULAR | Status: DC | PRN

## 2023-07-02 MED ORDER — LORAZEPAM 2 MG/ML IJ SOLN: 2.0000 mg | Freq: Four times a day (QID) | INTRAMUSCULAR | Status: DC | PRN

## 2023-07-02 MED ORDER — LORAZEPAM 0.5 MG PO TABS
0.5000 mg | ORAL_TABLET | ORAL | Status: AC | PRN
  Administered 2023-07-02: 0.5 mg via ORAL
  Filled 2023-07-02: qty 1

## 2023-07-02 MED ORDER — DIPHENHYDRAMINE HCL 50 MG/ML IJ SOLN: 50.0000 mg | Freq: Four times a day (QID) | INTRAMUSCULAR | Status: DC | PRN

## 2023-07-02 MED ORDER — OLANZAPINE 5 MG PO TBDP
5.0000 mg | ORAL_TABLET | Freq: Three times a day (TID) | ORAL | Status: DC | PRN
  Administered 2023-07-02: 5 mg via ORAL
  Filled 2023-07-02: qty 1

## 2023-07-02 MED ORDER — OLANZAPINE 5 MG PO TABS
5.0000 mg | ORAL_TABLET | Freq: Every day | ORAL | Status: DC
  Administered 2023-07-02 – 2023-07-03 (×2): 5 mg via ORAL
  Filled 2023-07-02 (×2): qty 1

## 2023-07-02 MED ORDER — ZIPRASIDONE MESYLATE 20 MG IM SOLR: 20.0000 mg | INTRAMUSCULAR | Status: DC | PRN

## 2023-07-02 MED ORDER — POTASSIUM CHLORIDE CRYS ER 20 MEQ PO TBCR
20.0000 meq | EXTENDED_RELEASE_TABLET | Freq: Every day | ORAL | Status: DC
  Administered 2023-07-02 – 2023-07-04 (×3): 20 meq via ORAL
  Filled 2023-07-02 (×3): qty 1

## 2023-07-02 NOTE — ED Notes (Signed)
Patient refusing medications at this time.  MD notified who is going to speak with patient.
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
## 2023-07-02 NOTE — BH Assessment (Signed)
Comprehensive Clinical Assessment (CCA) Note  07/02/2023 Lori [name] [id number]  Chief Complaint:  Chief Complaint  Patient presents with   Psychiatric Evaluation    Patient arrived in PD custody; IVC was taken out by PD for psychotic behavior; Patient was found running around her ex husbands house paranoid about genocide that will happen if Trump is elected president; She is also fixated on astrology and rambling about "the most powerful prayer" while in triage   Visit Diagnosis:   F22 Delusional disorder  Flowsheet Row ED from 07/02/2023 in [facility] Emergency Department at [facility] ED from 04/17/2022 in [facility] Emergency Department at [facility]  C-SSRS RISK CATEGORY No Risk No Risk      The patient demonstrates the following risk factors for suicide: Chronic risk factors for suicide include: psychiatric disorder of delusional disorder and substance use disorder. Acute risk factors for suicide include: social withdrawal/isolation. Protective factors for this patient include: positive social support, positive therapeutic relationship, coping skills, hope for the future, and life satisfaction. Considering these factors, the overall suicide risk at this point appears to be no risk. Patient is not appropriate for outpatient follow up.  Disposition: C. Penn NP, patient meets inpatient criteria.  [facility] contacted and bed availability under review.  Disposition discussed with Pharmacist, hospital.  RN to discuss with EDP.  Lori [name] is a 62 year old female who presents involuntary to [facility] via law enforcement and unaccompanied.  Pt IVC'd reads "Respondent has repeatedly called 911 today to state that she is fearful of the genocide that is coming and that Trump is going to kill people and that she is scared for her life.  After [name] was dispatched to her home for a well check she made additional comments that have no rational basis.  She is walking around outside and near the road in  her nightgown.   She appears disheveled and her hygiene appears poor.  Any previous mental disorders are unknown, however in her current mental state she is deemed a danger to herself. Pt denies SI, HI, or AVH.  When clinician asked if she wants to hurt herself, she reply's  no.  Pt presents the following symptoms: over talkative, increased energy, irritable, delusions, imaginary playmates, poor judgment, bizarre statements and imaginary vision. Pt refused to answer questions as it relates to sleep pattern; also; reports that she eats three meals daily. Pt reports she smokes marijuana (pot) daily , "I smoke a lot pot", however, denies drinking alcohol or using any other substance.  Pt admits to smoking cigarettes daily.  Pt unable to identify a primary stressor. Pt reports she lives with ex-husband and she has one daughter. Pt refused to answer question about support person; also refused to answer questions about mental illness and substance used in the family.  Pt denies any history of abuse or trauma.  Pt denies any guns or weapons in the home.  Pt denies any current legal problems.  Pt refused to answer any questions about weekly outpatient therapy; also, refused to answer question about receiving outpatient medication management.  Pt is dressed in scrubs, alert, oriented x 3 with articulation error and flight of ideas.  Also, patient presents restless motor behavior.  Eye contact is fleeting and Pt is tearful.  Pt's mood is anxious and affect is anxious. Thought process is distort and delusions.  Pt 's insight is flashes of insight and judgment is impaired.  There is indication Pt is currently responding to  internal stimuli or experiencing delusional thought content.  Pt was defensive and irritable throughout assessment.  CCA Screening, Triage and Referral (STR)  Patient Reported Information How did you hear about [place]? Legal System (BIB PD)  What Is the Reason for Your Visit/Call Today? Delusional,  Psychotic  How Long Has This Been Causing You Problems? 1 wk - 1 month  What Do You Feel Would Help You the Most Today? Treatment for Depression or other mood problem; Stress Management; Medication(s)   Have You Recently Had Any Thoughts About Hurting Yourself? No  Are You Planning to Commit Suicide/Harm Yourself At This time? No   Flowsheet Row ED from 07/02/2023 in [facility] Emergency Department at [facility] ED from 04/17/2022 in [facility] Emergency Department at [facility]  C-SSRS RISK CATEGORY No Risk No Risk       Have you Recently Had Thoughts About Hurting Someone Karolee Ohs? No  Are You Planning to Harm Someone at This Time? No  Explanation: n/a   Have You Used Any Alcohol or Drugs in the Past 24 Hours? Yes  What Did You Use and How Much? Marijuana "a lot"   Do You Currently Have a Therapist/Psychiatrist? No  Name of Therapist/Psychiatrist: Name of Therapist/Psychiatrist: n/a   Have You Been Recently Discharged From Any Office Practice or Programs? No  Explanation of Discharge From Practice/Program: n/a     CCA Screening Triage Referral Assessment Type of Contact: Face-to-Face  Telemedicine Service Delivery:   Is this Initial or Reassessment?   Date Telepsych consult ordered in [facility]:    Time Telepsych consult ordered in [facility]:    Location of Assessment: [facility] ED  Provider Location: [facility]   Collateral Involvement: No collateral involved   Does Patient Have a Court Appointed Legal Guardian? No  Legal Guardian Contact Information: n/a  Copy of Legal Guardianship Form: -- (n/a)  Legal Guardian Notified of Arrival: -- (n/a)  Legal Guardian Notified of Pending Discharge: -- (n/a)  If Minor and Not Living with Parent(s), Who has Custody? n/a  Is CPS involved or ever been involved? -- (n/a)  Is APS involved or ever been involved? -- (n/a)   Patient Determined To Be At Risk for Harm To Self or Others Based on Review  of Patient Reported Information or Presenting Complaint? No  Method: No Plan  Availability of Means: No access or NA  Intent: Vague intent or NA  Notification Required: No need or identified person  Additional Information for Danger to Others Potential: Active psychosis  Additional Comments for Danger to Others Potential: n/a  Are There Guns or Other Weapons in Your Home? -- (n/a)  Types of Guns/Weapons: No guns or weapons in the home  Are These Weapons Safely Secured?                            -- (n/a)  Who Could Verify You Are Able To Have These Secured: n/a  Do You Have any Outstanding Charges, Pending Court Dates, Parole/Probation? No  Contacted To Inform of Risk of Harm To Self or Others: Law Enforcement    Does Patient Present under Involuntary Commitment? Yes    [place] of Residence: [place]   Patient Currently Receiving the Following Services: Not Receiving Services   Determination of Need: Urgent (48 hours)   Options For Referral: Inpatient Hospitalization     CCA Biopsychosocial Patient Reported Schizophrenia/Schizoaffective Diagnosis in Past: No   Strengths: keeping  composure   Mental Health Symptoms Depression:   None   Duration of Depressive symptoms:    Mania:   Irritability; Change in energy/activity; Recklessness; Racing thoughts   Anxiety:    Worrying; Tension   Psychosis:   Affective flattening/alogia/avolition; Delusions; Grossly disorganized speech   Duration of Psychotic symptoms:  Duration of Psychotic Symptoms: Less than six months   Trauma:   N/A (Patient refused to answer. Requesting to discharge home. Patient's nurse/EDP provided disposition updates.)   Obsessions:   N/A (Patient refused to answer. Requesting to discharge home. Patient's nurse/EDP provided disposition updates.)   Compulsions:   N/A (Patient refused to answer. Requesting to discharge home. Patient's nurse/EDP provided disposition updates.)    Inattention:   None (Unable to determine. Refused to complete the TTS assessment.)   Hyperactivity/Impulsivity:   -- (Patient refused most of the TTS. Unable to determine.)   Oppositional/Defiant Behaviors:   N/A (Patient refused TTS. Unable to determine.)   Emotional Irregularity:   Potentially harmful impulsivity; Transient, stress-related paranoia/disassociation; Intense/inappropriate anger; Intense/unstable relationships (Patient refused TTS. Unable to determine.)   Other Mood/Personality Symptoms:   Anxious/Delusional    Mental Status Exam Appearance and self-care  Stature:   Average   Weight:   Average weight   Clothing:   Dirty; Disheveled (Pt dressed in scurbs)   Grooming:   Neglected   Cosmetic use:   Age appropriate   Posture/gait:   Normal   Motor activity:   Not Remarkable; Repetitive   Sensorium  Attention:   Distractible; Unaware   Concentration:   Scattered   Orientation:   Person; Object; Place   Recall/memory:   Defective in Recent   Affect and Mood  Affect:   Anxious; Negative; Tearful   Mood:   Angry; Anxious; Hypomania   Relating  Eye contact:   Fleeting   Facial expression:   Sad; Tense; Anxious; Angry   Attitude toward examiner:   Defensive; Argumentative; Resistant; Irritable   Thought and Language  Speech flow:  Articulation error; Flight of Ideas   Thought content:   Ideas of Reference; Delusions   Preoccupation:   None   Hallucinations:   Auditory; Command (Comment) (Patient refused to answer. Unable to determine.)   Organization:   Disorganized; Insurance underwriter of Knowledge:   Average   Intelligence:   Average   Abstraction:   Abstract   Judgement:   Poor   Reality Testing:   Unaware   Insight:   Flashes of insight; None/zero insight   Decision Making:   Impulsive   Social Functioning  Social Maturity:   Irresponsible; Impulsive   Social Judgement:    Impropriety   Stress  Stressors:   Other (Comment) (Fixatedon astrology and if Trump is elected president)   Coping Ability:   Overwhelmed; Exhausted   Skill Deficits:   Activities of daily living; Self-care; Self-control; Decision making; Communication   Supports:   Support needed     Religion: Religion/Spirituality Are You A Religious Person?:  (Unable to determine; patient refused to answer.) How Might This Affect Treatment?: not assessed (n/a)  Leisure/Recreation: Leisure / Recreation Do You Have Hobbies?:  (n/a)  Exercise/Diet: Exercise/Diet Do You Exercise?:  (n/a) Have You Gained or Lost A Significant Amount of Weight in the Past Six Months?: No (Unable to determine; patient refused to answer.) Do You Follow a Special Diet?: No (Unable to determine; patient refused to answer.) Do You Have Any Trouble Sleeping?: No (Unable to determine;  patient refused to answer.)   CCA Employment/Education Employment/Work Situation: Employment / Work Situation Employment Situation: Unemployed (Unable to determine; patient refused to answer.) Patient's Job has Been Impacted by Current Illness: No (Unable to determine; patient refused to answer.) Has Patient ever Been in the [employer]?: No (Unable to determine; patient refused to answer.)  Education: Education Is Patient Currently Attending School?: No Last Grade Completed:  (Unable to determine; patient refused to answer.) Did You Attend College?:  (Unable to determine; patient refused to answer.) Did You Have An Individualized Education Program (IIEP):  (Unable to determine; patient refused to answer.) Did You Have Any Difficulty At School?:  (Unable to determine; patient refused to answer.) Patient's Education Has Been Impacted by Current Illness:  (Pt refused to answer.)   CCA Family/Childhood History Family and Relationship History: Family history Marital status: Single Does patient have children?: Yes (Unable to  determine; patient refused to answer.) How many children?: 1 How is patient's relationship with their children?: n/a (n/a)  Childhood History:  Childhood History By whom was/is the patient raised?:  (Pt refused to answer.) Did patient suffer any verbal/emotional/physical/sexual abuse as a child?:  (Unable to determine; patient refused to answer.) Did patient suffer from severe childhood neglect?:  (Pt refused to answer) Has patient ever been sexually abused/assaulted/raped as an adolescent or adult?:  (Unable to determine; patient refused to answer.) Was the patient ever a victim of a crime or a disaster?:  (n/a) Witnessed domestic violence?:  (Unable to determine; patient refused to answer.) Has patient been affected by domestic violence as an adult?:  (Unable to determine; patient refused to answer.)       CCA Substance Use Alcohol/Drug Use: Alcohol / Drug Use Pain Medications: SEE MAR Prescriptions: SEE MAR Over the Counter: SEE MAR History of alcohol / drug use?: Yes (Unable to determine; patient refused to answer.) Longest period of sobriety (when/how long): Pt did not report sobreity Negative Consequences of Use:  (Pt refused to answer) Withdrawal Symptoms:  (Pt refused to answer) Substance #1 Name of Substance 1: Marijuana 1 - Age of First Use:  (Pt refused to answer) 1 - Amount (size/oz):  (Pt refused to answer) 1 - Frequency: daily 1 - Duration: ongoing 1 - Last Use / Amount: 07/02/23 1 - Method of Aquiring:  (Pt refused to answer) 1- Route of Use: smoking                       ASAM's:  Six Dimensions of Multidimensional Assessment  Dimension 1:  Acute Intoxication and/or Withdrawal Potential:   Dimension 1:  Description of individual's past and current experiences of substance use and withdrawal: Pt reports she smoke a lot of marijuana daily, unable to specify amount and why she smoke marijuana.  Dimension 2:  Biomedical Conditions and Complications:    Dimension 2:  Description of patient's biomedical conditions and  complications: Pt did not reports biomedical conditions  Dimension 3:  Emotional, Behavioral, or Cognitive Conditions and Complications:  Dimension 3:  Description of emotional, behavioral, or cognitive conditions and complications: Pt reports bipolar disorder  Dimension 4:  Readiness to Change:  Dimension 4:  Description of Readiness to Change criteria: contemplation  Dimension 5:  Relapse, Continued use, or Continued Problem Potential:  Dimension 5:  Relapse, continued use, or continued problem potential critiera description: continue to use  Dimension 6:  Recovery/Living Environment:  Dimension 6:  Recovery/Iiving environment criteria description: Pt did not reoprt living arrangements  ASAM  Severity Score: ASAM's Severity Rating Score: 11  ASAM Recommended Level of Treatment: ASAM Recommended Level of Treatment: Level I Outpatient Treatment   Substance use Disorder (SUD) Substance Use Disorder (SUD)  Checklist Symptoms of Substance Use: Continued use despite persistent or recurrent social, interpersonal problems, caused or exacerbated by use, Continued use despite having a persistent/recurrent physical/psychological problem caused/exacerbated by use, Recurrent use that results in a failure to fulfill major role obligations (work, school, home), Presence of craving or strong urge to use, Repeated use in physically hazardous situations (Patient has no psych providers in place at this time.)  Recommendations for Services/Supports/Treatments: Recommendations for Services/Supports/Treatments Recommendations For Services/Supports/Treatments: Medication Management, Inpatient Hospitalization  Discharge Disposition:    DSM5 Diagnoses: Patient Active Problem List   Diagnosis Date Noted   Major depressive disorder, recurrent episode, severe, with psychotic behavior (HCC) 03/25/2019   Anxiety 03/24/2019   Encounter for chronic pain  management 04/08/2017   Essential hypertension 08/19/2016   Hormone replacement therapy (HRT) 12/15/2015   Primary osteoarthritis of right knee 12/15/2015   Migraine without aura and with status migrainosus, not intractable 11/28/2015   Non-intractable cyclical vomiting with nausea 11/28/2015   Current smoker 07/17/2015   Fatty liver 09/01/2012   Renal cyst, right 01/14/2012   OCD (obsessive compulsive disorder) 12/23/2011   DYSTROPHIC NAILS 05/25/2010   UNSPECIFIED VITAMIN D DEFICIENCY 05/05/2010   Depression 12/11/2008   DRY EYE SYNDROME 12/04/2007   POLYARTHRALGIA 12/04/2007   Hyperlipidemia 09/05/2007   GEN OSTEOARTHROSIS INVOLVING MULTIPLE SITES 09/05/2007   Fibromyalgia 09/05/2007     Referrals to Alternative Service(s): Referred to Alternative Service(s):   Place:   Date:   Time:    Referred to Alternative Service(s):   Place:   Date:   Time:    Referred to Alternative Service(s):   Place:   Date:   Time:    Referred to Alternative Service(s):   Place:   Date:   Time:     Meryle Ready, Counselor

## 2023-07-02 NOTE — ED Notes (Signed)
Pt. Transferred to ED , room# 21 from triage .Patient was screened by security before entering the unit, changed out into facility scrubs. Report to include Situation, Background, Assessment and Recommendations from [place], [place] . Pt. Oriented to unit including Q15 minute rounds as well as the security cameras for their protection. Patient is alert and oriented, warm and dry in no acute distress.

## 2023-07-02 NOTE — ED Notes (Signed)
IVC/pending psych inpatient when medically cleared 
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
## 2023-07-02 NOTE — ED Provider Notes (Signed)
[facility] Provider Note   Event Date/Time   First MD Initiated Contact with Patient 07/02/23 1616     (approximate) History  Psychiatric Evaluation (Patient arrived in PD custody; IVC was taken out by PD for psychotic behavior; Patient was found running around her ex husbands house paranoid about genocide that will happen if Trump is elected president; She is also fixated on astrology and rambling about "the most powerful prayer" while in triage)  HPI Lori [name] is a 62 y.o. female with past medical history of depression and anxiety who presents under law enforcement custody after IVC was taken out by law enforcement for psychotic behavior.  Patient was reportedly found "running around her ex-husband's house paranoid about the genocide that will happen if Trump is elected president.  Also, she is fixated on astrology and rambling about "the most powerful prayer"".  Patient is not a reliable historian and is unable to say whether she is taking her medications on time and as prescribed.  Patient is reportedly speaking about "predictions" that she has made in the past and that her daughter is getting a PhD. ROS: Unable to assess   Physical Exam  Triage Vital Signs: ED Triage Vitals  Encounter Vitals Group     BP 07/02/23 1558 (!) 174/87     Systolic BP Percentile --      Diastolic BP Percentile --      Pulse Rate 07/02/23 1558 (!) 113     Resp 07/02/23 1558 19     Temp 07/02/23 1558 98.1 F (36.7 C)     Temp Source 07/02/23 1558 Oral     SpO2 07/02/23 1558 98 %     Weight 07/02/23 1559 150 lb (68 kg)     Height 07/02/23 1559 5\' 7"  (1.702 m)     Head Circumference --      Peak Flow --      Pain Score --      Pain Loc --      Pain Education --      Exclude from Growth Chart --    Most recent vital signs: Vitals:   07/02/23 1558 07/02/23 2107  BP: (!) 174/87 121/65  Pulse: (!) 113 81  Resp: 19 16  Temp: 98.1 F (36.7 C) 97.9 F (36.6 C)  SpO2: 98%  100%   General: Awake, cooperative CV:  Good peripheral perfusion.  Resp:  Normal effort.  Abd:  No distention.  Other:  Elderly well-developed, well-nourished Caucasian female resting comfortably in no acute distress ED Results / Procedures / Treatments  Labs (all labs ordered are listed, but only abnormal results are displayed) Labs Reviewed  COMPREHENSIVE METABOLIC PANEL - Abnormal; Notable for the following components:      Result Value   Potassium 3.0 (*)    CO2 21 (*)    Glucose, Bld 122 (*)    AST 231 (*)    ALT 501 (*)    All other components within normal limits  SALICYLATE LEVEL - Abnormal; Notable for the following components:   Salicylate Lvl <7.0 (*)    All other components within normal limits  ACETAMINOPHEN LEVEL - Abnormal; Notable for the following components:   Acetaminophen (Tylenol), Serum <10 (*)    All other components within normal limits  CBC - Abnormal; Notable for the following components:   WBC 11.1 (*)    All other components within normal limits  ETHANOL  URINE DRUG SCREEN, QUALITATIVE ([facility] ONLY)  HEPATITIS  PANEL, ACUTE  HEPATIC FUNCTION PANEL  RADIOLOGY ED MD interpretation: Right upper quadrant ultrasound independently interpreted by me shows a small simple hepatic cyst as well as a large simple right renal cyst -Agree with radiology assessment Official radiology report(s): US ABDOMEN LIMITED RUQ (LIVER/GB)  Result Date: 07/02/2023 CLINICAL DATA:  Transaminitis. EXAM: ULTRASOUND ABDOMEN LIMITED RIGHT UPPER QUADRANT COMPARISON:  January 10, 2012 FINDINGS: Gallbladder: No gallstones or wall thickening visualized (1.8 mm). No sonographic Murphy sign noted by sonographer. Common bile duct: Diameter: 4.1 mm Liver: A 0.8 cm x 0.8 cm x 1.0 cm simple cyst is seen within the left lobe of the liver. No abnormal flow is noted within this region on color Doppler evaluation. Within normal limits in parenchymal echogenicity. Portal vein is patent on color Doppler  imaging with normal direction of blood flow towards the liver. Other: Of incidental note is the presence of a 5.9 cm x 8.8 cm x 5.6 cm simple right renal cyst. IMPRESSION: 1. Small, simple hepatic cyst. 2. Large simple right renal cyst. Electronically Signed   By: Aram Candela M.D.   On: 07/02/2023 22:54   PROCEDURES: Critical Care performed: No Procedures MEDICATIONS ORDERED IN ED: Medications  diphenhydrAMINE (BENADRYL) injection 50 mg (has no administration in time range)  OLANZapine (ZYPREXA) tablet 5 mg (5 mg Oral Given 07/02/23 2208)  OLANZapine zydis (ZYPREXA) disintegrating tablet 5 mg (5 mg Oral Given 07/02/23 1919)    And  LORazepam (ATIVAN) tablet 0.5 mg (0.5 mg Oral Given 07/02/23 1918)    And  ziprasidone (GEODON) injection 20 mg (has no administration in time range)  potassium chloride SA (KLOR-CON M) CR tablet 20 mEq (20 mEq Oral Given 07/02/23 2207)   IMPRESSION / MDM / ASSESSMENT AND PLAN / ED COURSE  I reviewed the triage vital signs and the nursing notes.                             The patient is on the cardiac monitor to evaluate for evidence of arrhythmia and/or significant heart rate changes. Patient's presentation is most consistent with acute presentation with potential threat to life or bodily function. Patient presents under IVC for hallucinations/delusions. Thoughts are disorganized. No history of prior suicide attempt, and no SI or HI at this time. Clinically w/ no overt toxidrome, low suspicion for ingestion given hx and exam Thoughts unlikely 2/2 anemia, hypothyroidism, infection, or ICH. Patient's decision making capacity is compromised and they are unable to perform all ADL's (additionally they are without appropriate caretakers to assist through this deficit).  Consult: Psychiatry to evaluate patient for grave disability Disposition: Pending psychiatric evaluation  Care of this patient will be signed out the oncoming physician.  All pertinent patient  formation is conveyed and all questions answered.  All further care and disposition decisions will be made by the oncoming physician.   FINAL CLINICAL IMPRESSION(S) / ED DIAGNOSES   Final diagnoses:  Delusions (HCC)   Rx / DC Orders   ED Discharge Orders     None      Note:  This document was prepared using Dragon voice recognition software and may include unintentional dictation errors.   Merwyn Katos, MD 07/02/23 [phone]

## 2023-07-02 NOTE — ED Notes (Signed)
Pt asleep during snack time. EDT left ice cream, sandwich tray and drink in pt room.

## 2023-07-02 NOTE — ED Notes (Signed)
Ultrasound tech into perform ultrasound

## 2023-07-03 LAB — URINE DRUG SCREEN, QUALITATIVE (ARMC ONLY)
Amphetamines, Ur Screen: NOT DETECTED
Barbiturates, Ur Screen: NOT DETECTED
Benzodiazepine, Ur Scrn: NOT DETECTED
Cannabinoid 50 Ng, Ur ~~LOC~~: POSITIVE — AB
Cocaine Metabolite,Ur ~~LOC~~: NOT DETECTED
MDMA (Ecstasy)Ur Screen: NOT DETECTED
Methadone Scn, Ur: NOT DETECTED
Opiate, Ur Screen: NOT DETECTED
Phencyclidine (PCP) Ur S: NOT DETECTED
Tricyclic, Ur Screen: NOT DETECTED

## 2023-07-03 LAB — HEPATITIS PANEL, ACUTE
HCV Ab: NONREACTIVE
Hep A IgM: NONREACTIVE
Hep B C IgM: NONREACTIVE
Hepatitis B Surface Ag: NONREACTIVE

## 2023-07-03 LAB — HEPATIC FUNCTION PANEL
ALT: 365 U/L — ABNORMAL HIGH (ref 0–44)
AST: 115 U/L — ABNORMAL HIGH (ref 15–41)
Albumin: 4.6 g/dL (ref 3.5–5.0)
Alkaline Phosphatase: 84 U/L (ref 38–126)
Bilirubin, Direct: 0.2 mg/dL (ref 0.0–0.2)
Indirect Bilirubin: 0.6 mg/dL (ref 0.3–0.9)
Total Bilirubin: 0.8 mg/dL (ref 0.3–1.2)
Total Protein: 7.7 g/dL (ref 6.5–8.1)

## 2023-07-03 MED ORDER — OXYCODONE HCL 5 MG PO TABS
5.0000 mg | ORAL_TABLET | Freq: Four times a day (QID) | ORAL | Status: DC | PRN
  Administered 2023-07-03: 5 mg via ORAL
  Filled 2023-07-03: qty 1

## 2023-07-03 MED ORDER — LISINOPRIL 5 MG PO TABS
5.0000 mg | ORAL_TABLET | Freq: Once | ORAL | Status: AC
  Administered 2023-07-03: 5 mg via ORAL
  Filled 2023-07-03: qty 1

## 2023-07-03 MED ORDER — TRAMADOL HCL 50 MG PO TABS: 50.0000 mg | ORAL_TABLET | Freq: Three times a day (TID) | ORAL | Status: DC | PRN

## 2023-07-03 MED ORDER — LISINOPRIL 5 MG PO TABS
5.0000 mg | ORAL_TABLET | Freq: Every day | ORAL | Status: DC
  Administered 2023-07-03: 5 mg via ORAL
  Filled 2023-07-03: qty 1

## 2023-07-03 MED ORDER — ONDANSETRON 4 MG PO TBDP
4.0000 mg | ORAL_TABLET | Freq: Once | ORAL | Status: AC
  Administered 2023-07-03: 4 mg via ORAL
  Filled 2023-07-03: qty 1

## 2023-07-03 NOTE — ED Notes (Signed)
IVC /patient accepted to [facility] B[facility] Unit B /accepting  physician : Dr. Roselyn Reef call report [phone]  rep was Kia can arrive 07/04/23 anytime after 8:00 am

## 2023-07-03 NOTE — ED Notes (Signed)
Pt asleep. Breakfast at bedside.

## 2023-07-03 NOTE — BH Assessment (Signed)
Adult MH  Referral information for Psychiatric Hospitalization faxed to:   [facility] ([phone]-or- [phone]),   [facility] ([phone]),   [facility] ([phone] -or- [phone]),   [place] ([phone]---[phone]---[phone]),   [facility] ([phone] or [phone])   [facility] ([phone] or [phone]),   [facility] ([phone]) 

## 2023-07-03 NOTE — BH Assessment (Signed)
Patient has been accepted to [facility] Arrival time:  07/04/2023 anytime after 8 am Room Assignment:  [facility] Unit B Accepting Physician:  Dr. Roselyn Reef Call Report to:  [phone] Admission Rep:  Kia  ER Staff is are ED Secretary Quad RN:  Shawna Orleans

## 2023-07-03 NOTE — ED Notes (Signed)
Pt was given food and drink with safe utensils to eat with for lunch

## 2023-07-03 NOTE — ED Provider Notes (Signed)
Emergency Medicine Observation Re-evaluation Note  Lori [name] is a 62 y.o. female, seen on rounds today.  Pt initially presented to the ED for complaints of Psychiatric Evaluation (Patient arrived in PD custody; IVC was taken out by PD for psychotic behavior; Patient was found running around her ex husbands house paranoid about genocide that will happen if Trump is elected president; She is also fixated on astrology and rambling about "the most powerful prayer" while in triage) Currently, the patient is resting, voices no medical complaints.  Physical Exam  BP 121/65 (BP Location: Left Arm)   Pulse 81   Temp 97.9 F (36.6 C) (Oral)   Resp 16   Ht 5\' 7"  (1.702 m)   Wt 68 kg   SpO2 100%   BMI 23.49 kg/m  Physical Exam General: Resting in no acute distress Cardiac: No cyanosis Lungs: Equal rise and fall Psych: Not agitated  ED Course / MDM  EKG:   I have reviewed the labs performed to date as well as medications administered while in observation.  Recent changes in the last 24 hours include no events overnight; home medications ordered.  Plan  Current plan is for psychiatric disposition.    Irean Hong, MD 07/03/23 [phone]

## 2023-07-03 NOTE — ED Notes (Signed)
Patient sleeping, will recheck BP when patient wakes up

## 2023-07-03 NOTE — ED Notes (Signed)
Requesting edibles to help with nausea.

## 2023-07-04 MED ORDER — LISINOPRIL 10 MG PO TABS
10.0000 mg | ORAL_TABLET | Freq: Every day | ORAL | Status: DC
  Administered 2023-07-04: 10 mg via ORAL
  Filled 2023-07-04: qty 1

## 2023-07-04 NOTE — ED Notes (Signed)
Ambulated to [facility] transportation vehicle. 1 bag of belongings given to transport staff. Paperwork sent with transport.

## 2023-07-04 NOTE — Group Note (Deleted)

## 2023-07-04 NOTE — ED Provider Notes (Signed)
Emergency Medicine Observation Re-evaluation Note  Lori [name] is a 62 y.o. female, seen on rounds today.  Pt initially presented to the ED for complaints of Psychiatric Evaluation (Patient arrived in PD custody; IVC was taken out by PD for psychotic behavior; Patient was found running around her ex husbands house paranoid about genocide that will happen if Trump is elected president; She is also fixated on astrology and rambling about "the most powerful prayer" while in triage)  Currently, the patient is is no acute distress. Denies any concerns at this time.  Physical Exam  Blood pressure (!) 207/69, pulse 60, temperature (!) 97.5 F (36.4 C), resp. rate 16, height 5\' 7"  (1.702 m), weight 68 kg, SpO2 98%.  Physical Exam: General: No apparent distress Pulm: Normal WOB Neuro: Moving all extremities Psych: Resting comfortably     ED Course / MDM     I have reviewed the labs performed to date as well as medications administered while in observation.  Recent changes in the last 24 hours include: No acute events overnight.  Plan   Current plan: Patient awaiting psychiatric disposition. Patient is under full IVC at this time.    Willoughby Doell, Layla Maw, DO 07/04/23 [phone]

## 2023-07-04 NOTE — ED Notes (Signed)
EDP M Quale informed of pt latest BP. Meds adjusted, ordered to give early due to pt pending transfer

## 2023-07-04 NOTE — ED Notes (Signed)
RN tidy room, provided trash can for patient to throw away any trash in patient room with staff supervision.

## 2023-07-04 NOTE — ED Provider Notes (Signed)
Vitals:   07/03/23 1902 07/04/23 0747  BP: (!) 207/69 (!) 191/77  Pulse:  94  Resp:  14  Temp:  98.3 F (36.8 C)  SpO2:  98%      Patient's blood pressure noted to be consistently elevated.  Per nursing, patient asymptomatic.  Will increase lisinopril dose, nursing is checked several times of blood pressure is notably elevated.   Sharyn Creamer, MD 07/04/23 [phone]

## 2023-07-04 NOTE — ED Notes (Signed)
EMTALA reviewed by charge RN 

## 2023-07-04 NOTE — ED Notes (Signed)
Breakfast provided.

## 2023-07-04 NOTE — ED Notes (Signed)
[facility] Dept called for transport to [facility].

## 2023-09-15 ENCOUNTER — Other Ambulatory Visit: Payer: Self-pay

## 2023-09-15 ENCOUNTER — Emergency Department
Admission: EM | Admit: 2023-09-15 | Discharge: 2023-09-18 | Disposition: A | Discharge status: Other Acute Inpatient Hospital | Payer: BLUE CROSS/BLUE SHIELD | Attending: Emergency Medicine | Admitting: Emergency Medicine

## 2023-09-15 DIAGNOSIS — E876 Hypokalemia: Secondary | ICD-10-CM | POA: Insufficient documentation

## 2023-09-15 DIAGNOSIS — F29 Unspecified psychosis not due to a substance or known physiological condition: Secondary | ICD-10-CM | POA: Insufficient documentation

## 2023-09-15 DIAGNOSIS — I10 Essential (primary) hypertension: Secondary | ICD-10-CM | POA: Insufficient documentation

## 2023-09-15 DIAGNOSIS — F309 Manic episode, unspecified: Secondary | ICD-10-CM | POA: Insufficient documentation

## 2023-09-15 DIAGNOSIS — F333 Major depressive disorder, recurrent, severe with psychotic symptoms: Secondary | ICD-10-CM | POA: Diagnosis present

## 2023-09-15 DIAGNOSIS — Z20822 Contact with and (suspected) exposure to covid-19: Secondary | ICD-10-CM | POA: Insufficient documentation

## 2023-09-15 DIAGNOSIS — Z79899 Other long term (current) drug therapy: Secondary | ICD-10-CM | POA: Insufficient documentation

## 2023-09-15 LAB — SALICYLATE LEVEL: Salicylate Lvl: <7 mg/dL — ABNORMAL LOW (ref 7.0–30.0)

## 2023-09-15 LAB — URINE DRUG SCREEN, QUALITATIVE (ARMC ONLY)
Amphetamines, Ur Screen: NOT DETECTED
Barbiturates, Ur Screen: NOT DETECTED
Benzodiazepine, Ur Scrn: NOT DETECTED
Cannabinoid 50 Ng, Ur ~~LOC~~: POSITIVE — AB
Cocaine Metabolite,Ur ~~LOC~~: NOT DETECTED
MDMA (Ecstasy)Ur Screen: NOT DETECTED
Methadone Scn, Ur: NOT DETECTED
Opiate, Ur Screen: POSITIVE — AB
Phencyclidine (PCP) Ur S: NOT DETECTED
Tricyclic, Ur Screen: NOT DETECTED

## 2023-09-15 LAB — COMPREHENSIVE METABOLIC PANEL
ALT: 35 U/L (ref 0–44)
AST: 21 U/L (ref 15–41)
Albumin: 4.5 g/dL (ref 3.5–5.0)
Alkaline Phosphatase: 79 U/L (ref 38–126)
Anion gap: 12 (ref 5–15)
BUN: 10 mg/dL (ref 8–23)
CO2: 20 mmol/L — ABNORMAL LOW (ref 22–32)
Calcium: 10.2 mg/dL (ref 8.9–10.3)
Chloride: 107 mmol/L (ref 98–111)
Creatinine, Ser: 0.76 mg/dL (ref 0.44–1.00)
GFR, Estimated: >60 mL/min (ref >60)
Glucose, Bld: 156 mg/dL — ABNORMAL HIGH (ref 70–99)
Potassium: 2.8 mmol/L — ABNORMAL LOW (ref 3.5–5.1)
Sodium: 139 mmol/L (ref 135–145)
Total Bilirubin: 0.5 mg/dL (ref <1.2)
Total Protein: 7.9 g/dL (ref 6.5–8.1)

## 2023-09-15 LAB — CBC
HCT: 42.7 % (ref 36.0–46.0)
Hemoglobin: 14.7 g/dL (ref 12.0–15.0)
MCH: 28.5 pg (ref 26.0–34.0)
MCHC: 34.4 g/dL (ref 30.0–36.0)
MCV: 82.8 fL (ref 80.0–100.0)
Platelets: 314 10*3/uL (ref 150–400)
RBC: 5.16 MIL/uL — ABNORMAL HIGH (ref 3.87–5.11)
RDW: 12.8 % (ref 11.5–15.5)
WBC: 15.7 10*3/uL — ABNORMAL HIGH (ref 4.0–10.5)
nRBC: 0 % (ref 0.0–0.2)

## 2023-09-15 LAB — ACETAMINOPHEN LEVEL: Acetaminophen (Tylenol), Serum: <10 ug/mL — ABNORMAL LOW (ref 10–30)

## 2023-09-15 LAB — ETHANOL: Alcohol, Ethyl (B): <10 mg/dL (ref <10)

## 2023-09-15 MED ORDER — NICOTINE 7 MG/24HR TD PT24
7.0000 mg | MEDICATED_PATCH | Freq: Once | TRANSDERMAL | Status: AC
  Administered 2023-09-15: 7 mg via TRANSDERMAL
  Filled 2023-09-15: qty 1

## 2023-09-15 MED ORDER — LORAZEPAM 2 MG PO TABS
2.0000 mg | ORAL_TABLET | Freq: Once | ORAL | Status: AC
  Administered 2023-09-15: 2 mg via ORAL
  Filled 2023-09-15: qty 1

## 2023-09-15 MED ORDER — POTASSIUM CHLORIDE 20 MEQ PO PACK
80.0000 meq | PACK | Freq: Every day | ORAL | Status: DC
  Administered 2023-09-15 – 2023-09-16 (×2): 80 meq via ORAL
  Filled 2023-09-15 (×2): qty 4

## 2023-09-15 NOTE — ED Notes (Addendum)
Patient changed out into hospital scrubs at this time. Belongings placed in belongings bag and then into hospital locker. This RN, EDT Zachery Dakins, and [name] as witness.   Patient belongings as follows:  1 animal print hat 1 black floral shirt 1 tan bra 1 blue pants 1 pair black shoes 1 pair black socks 1 blue jacket

## 2023-09-15 NOTE — ED Notes (Signed)
Pt getting more and more agitated, coming out of room repeatedly. Modesto Charon MD notified and request for meds made

## 2023-09-15 NOTE — ED Triage Notes (Addendum)
BIB [name] with IVC papers. [name] reports that family member took out IVC papers for manic behavior and speaking to people/things that are not present. Pt speaking rapidly and with flight of ideas. Reports "he was trying to kill me" but does not state who. Pt states "I'm targeted. They hurt me to find those monkey's to save you!" Pt denies physical pain. Does not answer any other assessment questions. Pt ambulatory to triage with [name]. Breathing unlabored. Denies SI or HI.

## 2023-09-15 NOTE — ED Provider Notes (Signed)
[facility] Provider Note    Event Date/Time   First MD Initiated Contact with Patient 09/15/23 2048     (approximate)   History   IVC  and Psychiatric Evaluation (/)   HPI  Lori [name] is a 61 y.o. female   Past medical history of major depression with psychotic features, hypertension, hyperlipidemia, fibromyalgia, OCD who presents to the emergency department under police IVC.  Reportedly manic behavior and speaking to people and things that are not present, flight of ideas, paranoia.  When I assessed the patient, she is very difficult to interrupt, has flight of ideas, hyperreligiosity, speaking of God and God's decision to punish various people.  She states that she has not been taking her bipolar medications because they do not work.  She mentions the sensation of flying over the state of [place].  She denies any drug or alcohol use.   External Medical Documents Reviewed: September 2024 hospitalization for psychiatric illness      Physical Exam   Triage Vital Signs: ED Triage Vitals  Encounter Vitals Group     BP 09/15/23 2033 (!) 173/87     Systolic BP Percentile --      Diastolic BP Percentile --      Pulse Rate 09/15/23 2033 (!) 117     Resp 09/15/23 2033 (!) 21     Temp 09/15/23 2033 98 F (36.7 C)     Temp Source 09/15/23 2033 Oral     SpO2 09/15/23 2033 100 %     Weight 09/15/23 2032 140 lb (63.5 kg)     Height 09/15/23 2032 5\' 7"  (1.702 m)     Head Circumference --      Peak Flow --      Pain Score 09/15/23 2032 0     Pain Loc --      Pain Education --      Exclude from Growth Chart --     Most recent vital signs: Vitals:   09/15/23 2033  BP: (!) 173/87  Pulse: (!) 117  Resp: (!) 21  Temp: 98 F (36.7 C)  SpO2: 100%    General: Awake, no distress.  CV:  Good peripheral perfusion.  Resp:  Normal effort.  Abd:  No distention.  Other:  Pacing the room, hypertensive, agitated appearing, speaking flight of ideas,  nonsensical.  No signs of trauma.   ED Results / Procedures / Treatments   Labs (all labs ordered are listed, but only abnormal results are displayed) Labs Reviewed  COMPREHENSIVE METABOLIC PANEL - Abnormal; Notable for the following components:      Result Value   Potassium 2.8 (*)    CO2 20 (*)    Glucose, Bld 156 (*)    All other components within normal limits  SALICYLATE LEVEL - Abnormal; Notable for the following components:   Salicylate Lvl <7.0 (*)    All other components within normal limits  ACETAMINOPHEN LEVEL - Abnormal; Notable for the following components:   Acetaminophen (Tylenol), Serum <10 (*)    All other components within normal limits  CBC - Abnormal; Notable for the following components:   WBC 15.7 (*)    RBC 5.16 (*)    All other components within normal limits  URINE DRUG SCREEN, QUALITATIVE ([facility] ONLY) - Abnormal; Notable for the following components:   Opiate, Ur Screen POSITIVE (*)    Cannabinoid 50 Ng, Ur [place] POSITIVE (*)    All other components within normal limits  ETHANOL     I ordered and reviewed the above labs they are notable for potassium is low at 2.8.  White blood cell count mildly elevated 15.7.  EKG  ED ECG REPORT I, Pilar Jarvis, the attending physician, personally viewed and interpreted this ECG.   Date: 09/15/2023  EKG Time: 2207  Rate: 108  Rhythm: sinus tachycardia  Axis: nl  Intervals:none, QTC 477  ST&T Change: no stemi     PROCEDURES:  Critical Care performed: No  Procedures   MEDICATIONS ORDERED IN ED: Medications  nicotine (NICODERM CQ - dosed in mg/24 hr) patch 7 mg (7 mg Transdermal Patch Applied 09/15/23 2207)  potassium chloride (KLOR-CON) packet 80 mEq (80 mEq Oral Given 09/15/23 2202)    IMPRESSION / MDM / ASSESSMENT AND PLAN / ED COURSE  I reviewed the triage vital signs and the nursing notes.                                Patient's presentation is most consistent with acute presentation with  potential threat to life or bodily function.  Differential diagnosis includes, but is not limited to, acute decompensated psychiatric illness, intoxication, substance-induced mood disorder   The patient is on the cardiac monitor to evaluate for evidence of arrhythmia and/or significant heart rate changes.  MDM:    Patient with history of major depression with psychotic features, recent hospitalization for psychiatric illness in September who presents to the emergency department with paranoid/delusional/psychosis.  Lab tests, toxicologic tests largely unremarkable except for some mild hypokalemia for which repletion was ordered.  EKG with no significant interval abnormalities.  Psychiatric evaluation.  IVC       FINAL CLINICAL IMPRESSION(S) / ED DIAGNOSES   Final diagnoses:  Psychosis, unspecified psychosis type (HCC)  Hypokalemia     Rx / DC Orders   ED Discharge Orders     None        Note:  This document was prepared using Dragon voice recognition software and may include unintentional dictation errors.    Pilar Jarvis, MD 09/15/23 [phone]

## 2023-09-15 NOTE — ED Notes (Signed)
Pt requesting nicotine patch,Wong MD notified

## 2023-09-15 NOTE — ED Notes (Signed)
Pt given potassium packets in water and encouraged to drink to raise potassium level

## 2023-09-15 NOTE — BH Assessment (Signed)
Comprehensive Clinical Assessment (CCA) Note  09/15/2023 REALYNN [name] [id number]  Chief Complaint: Patient is a 62 year old female presenting to [facility] ED under IVC. Per triage note, law enforcement officer reports that family member took out IVC papers for manic behavior and speaking to people/things that are not present. Pt speaking rapidly and with flight of ideas. Reports "he was trying to kill me" but does not state who. Pt states "I'm targeted. They hurt me to find those monkey's to save you!" Pt denies physical pain. Does not answer any other assessment questions. Pt ambulatory to triage with [name]. Breathing unlabored. Denies SI or HI. During assessment patient can be seen pacing around her room and responding to internal stimuli. Patient is unable to answer questions with a clear answer, she will go on tangents and her thoughts are disorganized. When asked if she is experiencing AH, she denies but then reports "they put the voices in my head" along with other nonsensical statements. When asked if she is taking her medications for her mental health she responds "no they make me crazy, I can't sleep, I'm up for 9 days straight" patient is able to report on her sleeping habits "I don't need sleep." Patient is able to deny SI/HI/AH/VH but is currently responding to internal stimuli.  Chief Complaint  Patient presents with   IVC    Psychiatric Evaluation        Visit Diagnosis: Major Depressive Disorder, recurrent episode with psychosis per hx    CCA Screening, Triage and Referral (STR)  Patient Reported Information How did you hear about [place]? Legal System  Referral name: No data recorded Referral phone number: No data recorded  Whom do you see for routine medical problems? No data recorded Practice/Facility Name: No data recorded Practice/Facility Phone Number: No data recorded Name of Contact: No data recorded Contact Number: No data recorded Contact Fax Number: No data  recorded Prescriber Name: No data recorded Prescriber Address (if known): No data recorded  What Is the Reason for Your Visit/Call Today? BIB [name] with IVC papers. [name] reports that family member took out IVC papers for manic behavior and speaking to people/things that are not present. Pt speaking rapidly and with flight of ideas. Reports "he was trying to kill me" but does not state who. Pt states "I'm targeted. They hurt me to find those monkey's to save you!" Pt denies physical pain. Does not answer any other assessment questions. Pt ambulatory to triage with [name]. Breathing unlabored. Denies SI or HI.  How Long Has This Been Causing You Problems? > than 6 months  What Do You Feel Would Help You the Most Today? Treatment for Depression or other mood problem; Stress Management; Medication(s)   Have You Recently Been in Any Inpatient Treatment (Hospital/Detox/Crisis Center/28-Day Program)? No data recorded Name/Location of Program/Hospital:No data recorded How Long Were You There? No data recorded When Were You Discharged? No data recorded  Have You Ever Received Services From [facility] Before? No data recorded Who Do You See at [facility]? No data recorded  Have You Recently Had Any Thoughts About Hurting Yourself? No  Are You Planning to Commit Suicide/Harm Yourself At This time? No   Have you Recently Had Thoughts About Hurting Someone Karolee Ohs? No  Explanation: n/a   Have You Used Any Alcohol or Drugs in the Past 24 Hours? -- (Unknown)  How Long Ago Did You Use Drugs or Alcohol? No data recorded What Did You Use and How Much? Marijuana "  a lot"   Do You Currently Have a Therapist/Psychiatrist? -- (Unknown)  Name of Therapist/Psychiatrist: n/a   Have You Been Recently Discharged From Any Office Practice or Programs? No  Explanation of Discharge From Practice/Program: n/a     CCA Screening Triage Referral Assessment Type of Contact: Face-to-Face  Is this Initial or  Reassessment? No data recorded Date Telepsych consult ordered in [facility]:  No data recorded Time Telepsych consult ordered in [facility]:  No data recorded  Patient Reported Information Reviewed? No data recorded Patient Left Without Being Seen? No data recorded Reason for Not Completing Assessment: No data recorded  Collateral Involvement: No collateral involved   Does Patient Have a Court Appointed Legal Guardian? No data recorded Name and Contact of Legal Guardian: No data recorded If Minor and Not Living with Parent(s), Who has Custody? n/a  Is CPS involved or ever been involved? Never  Is APS involved or ever been involved? Never   Patient Determined To Be At Risk for Harm To Self or Others Based on Review of Patient Reported Information or Presenting Complaint? No  Method: No Plan  Availability of Means: No access or NA  Intent: Vague intent or NA  Notification Required: No need or identified person  Additional Information for Danger to Others Potential: Active psychosis  Additional Comments for Danger to Others Potential: n/a  Are There Guns or Other Weapons in Your Home? No  Types of Guns/Weapons: No guns or weapons in the home  Are These Weapons Safely Secured?                            -- (n/a)  Who Could Verify You Are Able To Have These Secured: n/a  Do You Have any Outstanding Charges, Pending Court Dates, Parole/Probation? No  Contacted To Inform of Risk of Harm To Self or Others: Law Enforcement   Location of Assessment: [facility] ED   Does Patient Present under Involuntary Commitment? Yes  IVC Papers Initial File Date: No data recorded  [place] of Residence: [place]   Patient Currently Receiving the Following Services: Not Receiving Services   Determination of Need: Emergent (2 hours)   Options For Referral: Inpatient Hospitalization     CCA Biopsychosocial Intake/Chief Complaint:  No data recorded Current Symptoms/Problems: No data  recorded  Patient Reported Schizophrenia/Schizoaffective Diagnosis in Past: Yes   Strengths: Patient is able to communicate and complete her ADLs  Preferences: No data recorded Abilities: No data recorded  Type of Services Patient Feels are Needed: No data recorded  Initial Clinical Notes/Concerns: No data recorded  Mental Health Symptoms Depression:   None   Duration of Depressive symptoms: No data recorded  Mania:   Change in energy/activity; Euphoria; Increased Energy; Irritability; Overconfidence; Racing thoughts; Recklessness   Anxiety:    Difficulty concentrating; Restlessness; Worrying; Tension; Sleep   Psychosis:   Delusions; Grossly disorganized or catatonic behavior; Grossly disorganized speech; Hallucinations   Duration of Psychotic symptoms:  Greater than six months   Trauma:   -- (UTA)   Obsessions:   Cause anxiety; Poor insight; Recurrent & persistent thoughts/impulses/images   Compulsions:   Absent insight/delusional; Poor Insight; Repeated behaviors/mental acts; "Driven" to perform behaviors/acts   Inattention:   Disorganized   Hyperactivity/Impulsivity:   N/A   Oppositional/Defiant Behaviors:   N/A   Emotional Irregularity:   N/A   Other Mood/Personality Symptoms:   Anxious/Delusional    Mental Status Exam Appearance and self-care  Stature:  Average   Weight:   Average weight   Clothing:   Disheveled   Grooming:   Neglected   Cosmetic use:   None   Posture/gait:   Tense   Motor activity:   Restless   Sensorium  Attention:   Distractible; Inattentive; Unaware   Concentration:   Focuses on irrelevancies; Preoccupied; Scattered   Orientation:   Place; Person   Recall/memory:   Defective in Recent; Defective in Remote   Affect and Mood  Affect:   Anxious; Labile   Mood:   Anxious; Hypomania   Relating  Eye contact:   Avoided   Facial expression:   Anxious   Attitude toward examiner:    Cooperative   Thought and Language  Speech flow:  Flight of Ideas   Thought content:   Delusions; Persecutions   Preoccupation:   Ruminations   Hallucinations:   -- (Patient denies but can be seen pacing in her room and talking to herself)   Organization:  No data recorded  [employer] of Knowledge:   Fair   Intelligence:   Average   Abstraction:   Functional   Judgement:   Poor   Reality Testing:   Distorted; Unaware   Insight:   Poor; None/zero insight   Decision Making:   Impulsive   Social Functioning  Social Maturity:   Impulsive   Social Judgement:   Heedless   Stress  Stressors:   Illness   Coping Ability:   Contractor Deficits:   None   Supports:   Support needed     Religion: Religion/Spirituality Are You A Religious Person?:  Industrial/product designer)  Leisure/Recreation: Leisure / Recreation Do You Have Hobbies?:  (UTA)  Exercise/Diet: Exercise/Diet Do You Exercise?:  (UTA) Have You Gained or Lost A Significant Amount of Weight in the Past Six Months?:  (UTA) Do You Follow a Special Diet?:  (UTA) Do You Have Any Trouble Sleeping?: Yes Explanation of Sleeping Difficulties: Patient reports "I don't sleep"   CCA Employment/Education Employment/Work Situation: Employment / Work Situation Employment Situation: Unemployed Has Patient ever Been in Equities trader?: No  Education: Education Is Patient Currently Attending School?: No Did You Have An Individualized Education Program (IIEP):  (UTA) Did You Have Any Difficulty At [employer]?:  (UTA) Patient's Education Has Been Impacted by Current Illness:  (UTA)   CCA Family/Childhood History Family and Relationship History: Family history Marital status: Single Does patient have children?:  (UTA)  Childhood History:  Childhood History Did patient suffer any verbal/emotional/physical/sexual abuse as a child?:  (UTA) Did patient suffer from severe childhood neglect?:   (UA) Has patient ever been sexually abused/assaulted/raped as an adolescent or adult?:  (UTA) Was the patient ever a victim of a crime or a disaster?:  (UTA) Witnessed domestic violence?:  (UTA) Has patient been affected by domestic violence as an adult?:  Industrial/product designer)  Child/Adolescent Assessment:     CCA Substance Use Alcohol/Drug Use: Alcohol / Drug Use Pain Medications: SEE MAR Prescriptions: SEE MAR Over the Counter: SEE MAR History of alcohol / drug use?:  (UTA)                         ASAM's:  Six Dimensions of Multidimensional Assessment  Dimension 1:  Acute Intoxication and/or Withdrawal Potential:      Dimension 2:  Biomedical Conditions and Complications:      Dimension 3:  Emotional, Behavioral, or Cognitive Conditions and Complications:  Dimension 4:  Readiness to Change:     Dimension 5:  Relapse, Continued use, or Continued Problem Potential:     Dimension 6:  Recovery/Living Environment:     ASAM Severity Score:    ASAM Recommended Level of Treatment:     Substance use Disorder (SUD)    Recommendations for Services/Supports/Treatments:    DSM5 Diagnoses: Patient Active Problem List   Diagnosis Date Noted   Major depressive disorder, recurrent episode, severe, with psychotic behavior (HCC) 03/25/2019   Anxiety 03/24/2019   Encounter for chronic pain management 04/08/2017   Essential hypertension 08/19/2016   Hormone replacement therapy (HRT) 12/15/2015   Primary osteoarthritis of right knee 12/15/2015   Migraine without aura and with status migrainosus, not intractable 11/28/2015   Non-intractable cyclical vomiting with nausea 11/28/2015   Current smoker 07/17/2015   Fatty liver 09/01/2012   Renal cyst, right 01/14/2012   OCD (obsessive compulsive disorder) 12/23/2011   DYSTROPHIC NAILS 05/25/2010   Vitamin D deficiency 05/05/2010   Depression 12/11/2008   Tear film insufficiency 12/04/2007   POLYARTHRALGIA 12/04/2007   Hyperlipidemia  09/05/2007   GEN OSTEOARTHROSIS INVOLVING MULTIPLE SITES 09/05/2007   Fibromyalgia 09/05/2007    Patient Centered Plan: Patient is on the following Treatment Plan(s):  Depression and Impulse Control   Referrals to Alternative Service(s): Referred to Alternative Service(s):   Place:   Date:   Time:    Referred to Alternative Service(s):   Place:   Date:   Time:    Referred to Alternative Service(s):   Place:   Date:   Time:    Referred to Alternative Service(s):   Place:   Date:   Time:      @BHCOLLABOFCARE @  [employer], LCAS-A

## 2023-09-16 DIAGNOSIS — F309 Manic episode, unspecified: Secondary | ICD-10-CM | POA: Diagnosis not present

## 2023-09-16 MED ORDER — ZIPRASIDONE MESYLATE 20 MG IM SOLR: 20.0000 mg | INTRAMUSCULAR | Status: DC | PRN

## 2023-09-16 MED ORDER — OLANZAPINE 5 MG PO TBDP: 10.0000 mg | ORAL_TABLET | Freq: Three times a day (TID) | ORAL | Status: DC | PRN

## 2023-09-16 MED ORDER — GABAPENTIN 100 MG PO CAPS
200.0000 mg | ORAL_CAPSULE | Freq: Two times a day (BID) | ORAL | Status: DC
  Administered 2023-09-16 – 2023-09-18 (×5): 200 mg via ORAL
  Filled 2023-09-16 (×5): qty 2

## 2023-09-16 MED ORDER — OXYCODONE HCL 5 MG PO TABS
5.0000 mg | ORAL_TABLET | Freq: Four times a day (QID) | ORAL | Status: DC | PRN
  Administered 2023-09-16 – 2023-09-17 (×2): 5 mg via ORAL
  Filled 2023-09-16 (×2): qty 1

## 2023-09-16 MED ORDER — LORAZEPAM 1 MG PO TABS
1.0000 mg | ORAL_TABLET | ORAL | Status: AC | PRN
  Administered 2023-09-17: 1 mg via ORAL
  Filled 2023-09-16: qty 1

## 2023-09-16 MED ORDER — OLANZAPINE 10 MG PO TABS
10.0000 mg | ORAL_TABLET | Freq: Two times a day (BID) | ORAL | Status: DC
  Administered 2023-09-16 – 2023-09-18 (×5): 10 mg via ORAL
  Filled 2023-09-16 (×5): qty 1

## 2023-09-16 MED ORDER — POTASSIUM CHLORIDE CRYS ER 20 MEQ PO TBCR
40.0000 meq | EXTENDED_RELEASE_TABLET | Freq: Two times a day (BID) | ORAL | Status: DC
Start: 2023-09-16 — End: 2023-09-17
  Administered 2023-09-16 (×2): 40 meq via ORAL
  Filled 2023-09-16 (×3): qty 2

## 2023-09-16 MED ORDER — OXCARBAZEPINE 150 MG PO TABS
150.0000 mg | ORAL_TABLET | Freq: Two times a day (BID) | ORAL | Status: DC
  Administered 2023-09-16 – 2023-09-18 (×5): 150 mg via ORAL
  Filled 2023-09-16 (×5): qty 1

## 2023-09-16 NOTE — ED Notes (Signed)
IVC  CONSULT  DONE  PENDING  PLACEMENT 

## 2023-09-16 NOTE — ED Notes (Signed)
Pt's dinner placed at bedside.
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
## 2023-09-16 NOTE — ED Notes (Signed)
Hospital meal provided, pt tolerated w/o complaints.  Waste discarded appropriately.  

## 2023-09-16 NOTE — ED Notes (Addendum)
Telepsych computer placed at bedside for pt evaluation. Juice provided per pt request.

## 2023-09-16 NOTE — ED Notes (Signed)
Pt is asleep breakfast placed at bedside.

## 2023-09-16 NOTE — BH Assessment (Signed)
TTS updated [facility] AC Debbe Bales M.), about the patient disposition for inpatient treatment. Patient under review for a bed within [facility] service line.

## 2023-09-17 LAB — POTASSIUM: Potassium: 4 mmol/L (ref 3.5–5.1)

## 2023-09-17 LAB — SARS CORONAVIRUS 2 BY RT PCR: SARS Coronavirus 2 by RT PCR: NEGATIVE

## 2023-09-17 NOTE — ED Notes (Signed)
Ivc /Recommend psych inpatient admission when medically cleared

## 2023-09-17 NOTE — ED Provider Notes (Signed)
Emergency Medicine Observation Re-evaluation Note  Lori [name] is a 62 y.o. female, seen on rounds today.  Pt initially presented to the ED for complaints of IVC  and Psychiatric Evaluation (/) Currently, the patient is resting, voices no medical complaints.  Physical Exam  BP 123/69 (BP Location: Left Arm)   Pulse 100   Temp (!) 97.5 F (36.4 C) (Oral)   Resp 19   Ht 5\' 7"  (1.702 m)   Wt 63.5 kg   SpO2 98%   BMI 21.93 kg/m  Physical Exam General: Resting in no acute distress Cardiac: No cyanosis Lungs: Equal rise and fall Psych: Not agitated  ED Course / MDM  EKG:   I have reviewed the labs performed to date as well as medications administered while in observation.  Recent changes in the last 24 hours include no events overnight.  Plan  Current plan is for psychiatric disposition.    Irean Hong, MD 09/17/23 [phone]

## 2023-09-17 NOTE — BH Assessment (Signed)
Per [facility] AC Tresa Endo), patient to be referred out of system.  Referral information for Psychiatric Hospitalization faxed to;   [facility] [phone]- [phone]) No beds available.  [facility] [phone]- [phone]),   [facility] [phone] -[phone]) [phone]  [facility] Pl[facility] [phone]),  [address] [phone]),   [facility] [phone] -or- [phone]),   [facility] [phone]),  [place] [phone])  [place] [phone] or [phone]),   [facility] [phone])

## 2023-09-17 NOTE — ED Notes (Signed)
Patient is IVC pending pych inpatient admit

## 2023-09-17 NOTE — ED Notes (Signed)
Pt was given a drink and their dinner tray.

## 2023-09-17 NOTE — ED Notes (Signed)
Hospital meal provided, pt tolerated w/o complaints.  Waste discarded appropriately.  

## 2023-09-18 ENCOUNTER — Other Ambulatory Visit: Payer: Self-pay

## 2023-09-18 ENCOUNTER — Encounter: Payer: Self-pay | Admitting: Adult Health

## 2023-09-18 ENCOUNTER — Inpatient Hospital Stay
Admission: AD | Admit: 2023-09-18 | Discharge: 2023-09-28 | DRG: 885 | Disposition: A | Discharge status: Home / Self Care | Payer: BLUE CROSS/BLUE SHIELD | Source: Intra-hospital | Attending: Psychiatry | Admitting: Psychiatry

## 2023-09-18 DIAGNOSIS — F429 Obsessive-compulsive disorder, unspecified: Secondary | ICD-10-CM | POA: Diagnosis present

## 2023-09-18 DIAGNOSIS — I10 Essential (primary) hypertension: Secondary | ICD-10-CM | POA: Diagnosis present

## 2023-09-18 DIAGNOSIS — Z8249 Family history of ischemic heart disease and other diseases of the circulatory system: Secondary | ICD-10-CM

## 2023-09-18 DIAGNOSIS — F29 Unspecified psychosis not due to a substance or known physiological condition: Principal | ICD-10-CM | POA: Diagnosis present

## 2023-09-18 DIAGNOSIS — Z886 Allergy status to analgesic agent status: Secondary | ICD-10-CM | POA: Diagnosis not present

## 2023-09-18 DIAGNOSIS — Z91128 Patient's intentional underdosing of medication regimen for other reason: Secondary | ICD-10-CM | POA: Diagnosis not present

## 2023-09-18 DIAGNOSIS — Z823 Family history of stroke: Secondary | ICD-10-CM | POA: Diagnosis not present

## 2023-09-18 DIAGNOSIS — M797 Fibromyalgia: Secondary | ICD-10-CM | POA: Diagnosis present

## 2023-09-18 DIAGNOSIS — Z882 Allergy status to sulfonamides status: Secondary | ICD-10-CM

## 2023-09-18 DIAGNOSIS — F17210 Nicotine dependence, cigarettes, uncomplicated: Secondary | ICD-10-CM | POA: Diagnosis present

## 2023-09-18 DIAGNOSIS — M1711 Unilateral primary osteoarthritis, right knee: Secondary | ICD-10-CM | POA: Diagnosis present

## 2023-09-18 DIAGNOSIS — Z8269 Family history of other diseases of the musculoskeletal system and connective tissue: Secondary | ICD-10-CM

## 2023-09-18 DIAGNOSIS — Z79899 Other long term (current) drug therapy: Secondary | ICD-10-CM

## 2023-09-18 DIAGNOSIS — Z90710 Acquired absence of both cervix and uterus: Secondary | ICD-10-CM

## 2023-09-18 DIAGNOSIS — E876 Hypokalemia: Secondary | ICD-10-CM | POA: Diagnosis present

## 2023-09-18 DIAGNOSIS — D72829 Elevated white blood cell count, unspecified: Secondary | ICD-10-CM | POA: Diagnosis present

## 2023-09-18 DIAGNOSIS — Z888 Allergy status to other drugs, medicaments and biological substances status: Secondary | ICD-10-CM | POA: Diagnosis not present

## 2023-09-18 DIAGNOSIS — Z90722 Acquired absence of ovaries, bilateral: Secondary | ICD-10-CM | POA: Diagnosis not present

## 2023-09-18 DIAGNOSIS — E785 Hyperlipidemia, unspecified: Secondary | ICD-10-CM | POA: Diagnosis present

## 2023-09-18 DIAGNOSIS — Z5986 Financial insecurity: Secondary | ICD-10-CM | POA: Diagnosis not present

## 2023-09-18 DIAGNOSIS — F333 Major depressive disorder, recurrent, severe with psychotic symptoms: Secondary | ICD-10-CM | POA: Diagnosis present

## 2023-09-18 DIAGNOSIS — Z83438 Family history of other disorder of lipoprotein metabolism and other lipidemia: Secondary | ICD-10-CM | POA: Diagnosis not present

## 2023-09-18 DIAGNOSIS — Z5982 Transportation insecurity: Secondary | ICD-10-CM | POA: Diagnosis not present

## 2023-09-18 DIAGNOSIS — T43226A Underdosing of selective serotonin reuptake inhibitors, initial encounter: Secondary | ICD-10-CM | POA: Diagnosis present

## 2023-09-18 MED ORDER — OLANZAPINE 5 MG PO TBDP: 10.0000 mg | ORAL_TABLET | Freq: Three times a day (TID) | ORAL | Status: DC | PRN

## 2023-09-18 MED ORDER — LISINOPRIL 20 MG PO TABS
10.0000 mg | ORAL_TABLET | Freq: Every day | ORAL | Status: DC
  Administered 2023-09-18 – 2023-09-28 (×11): 10 mg via ORAL
  Filled 2023-09-18 (×11): qty 1

## 2023-09-18 MED ORDER — PERPHENAZINE 4 MG PO TABS
4.0000 mg | ORAL_TABLET | Freq: Three times a day (TID) | ORAL | Status: DC
  Administered 2023-09-18 – 2023-09-19 (×3): 4 mg via ORAL
  Filled 2023-09-18 (×5): qty 1

## 2023-09-18 MED ORDER — MAGNESIUM HYDROXIDE 400 MG/5ML PO SUSP: 30.0000 mL | Freq: Every day | ORAL | Status: DC | PRN

## 2023-09-18 MED ORDER — GABAPENTIN 100 MG PO CAPS
200.0000 mg | ORAL_CAPSULE | Freq: Two times a day (BID) | ORAL | Status: DC
  Administered 2023-09-18 – 2023-09-28 (×20): 200 mg via ORAL
  Filled 2023-09-18 (×20): qty 2

## 2023-09-18 MED ORDER — OLANZAPINE 5 MG PO TABS
10.0000 mg | ORAL_TABLET | Freq: Two times a day (BID) | ORAL | Status: DC
  Administered 2023-09-18 – 2023-09-19 (×2): 10 mg via ORAL
  Filled 2023-09-18 (×2): qty 2

## 2023-09-18 MED ORDER — ZIPRASIDONE MESYLATE 20 MG IM SOLR: 20.0000 mg | INTRAMUSCULAR | Status: DC | PRN

## 2023-09-18 MED ORDER — DIPHENHYDRAMINE HCL 25 MG PO CAPS
50.0000 mg | ORAL_CAPSULE | Freq: Every evening | ORAL | Status: DC | PRN
  Administered 2023-09-18 – 2023-09-26 (×5): 50 mg via ORAL
  Filled 2023-09-18 (×6): qty 2

## 2023-09-18 MED ORDER — ALUM & MAG HYDROXIDE-SIMETH 200-200-20 MG/5ML PO SUSP: 30.0000 mL | ORAL | Status: DC | PRN

## 2023-09-18 MED ORDER — OXYCODONE HCL 5 MG PO TABS
5.0000 mg | ORAL_TABLET | Freq: Four times a day (QID) | ORAL | Status: DC | PRN
  Administered 2023-09-18 – 2023-09-25 (×18): 5 mg via ORAL
  Filled 2023-09-18 (×17): qty 1

## 2023-09-18 MED ORDER — ESTROGENS CONJUGATED 0.625 MG PO TABS
0.6250 mg | ORAL_TABLET | Freq: Every day | ORAL | Status: DC
  Administered 2023-09-18 – 2023-09-28 (×11): 0.625 mg via ORAL
  Filled 2023-09-18 (×11): qty 1

## 2023-09-18 MED ORDER — OXCARBAZEPINE 150 MG PO TABS
150.0000 mg | ORAL_TABLET | Freq: Two times a day (BID) | ORAL | Status: DC
  Administered 2023-09-18 – 2023-09-21 (×6): 150 mg via ORAL
  Filled 2023-09-18 (×6): qty 1

## 2023-09-18 MED ORDER — DIPHENHYDRAMINE HCL 25 MG PO CAPS: 50.0000 mg | ORAL_CAPSULE | Freq: Every evening | ORAL | Status: DC | PRN

## 2023-09-18 MED ORDER — MELATONIN 3 MG PO TABS: 3.0000 mg | ORAL_TABLET | Freq: Every day | ORAL | Status: DC

## 2023-09-18 MED ORDER — EZETIMIBE 10 MG PO TABS
10.0000 mg | ORAL_TABLET | Freq: Every day | ORAL | Status: DC
  Administered 2023-09-18 – 2023-09-28 (×11): 10 mg via ORAL
  Filled 2023-09-18 (×11): qty 1

## 2023-09-18 MED ORDER — MELATONIN 5 MG PO TABS
10.0000 mg | ORAL_TABLET | Freq: Every day | ORAL | Status: DC
  Administered 2023-09-18: 10 mg via ORAL
  Filled 2023-09-18 (×2): qty 2

## 2023-09-18 NOTE — BH Assessment (Signed)
Writer attempted to call emergency contact Adlean Newhard [phone] to give update on patient disposition. No answer.

## 2023-09-18 NOTE — ED Notes (Signed)
Hospital meal provided, pt tolerated w/o complaints.  Waste discarded appropriately.  

## 2023-09-18 NOTE — Tx Team (Signed)
 Initial Treatment Plan 09/18/2023 5:45 PM Lori [name] RUE:[id number]    PATIENT STRESSORS: Marital or family conflict     PATIENT STRENGTHS: Capable of independent living  Physical Health    PATIENT IDENTIFIED PROBLEMS: People do not understand her "visions"                       DISCHARGE CRITERIA:  Adequate post-discharge living arrangements Improved stabilization in mood, thinking, and/or behavior  PRELIMINARY DISCHARGE PLAN: Return to previous living arrangement  PATIENT/FAMILY INVOLVEMENT: This treatment plan has been presented to and reviewed with the patient,   The patient Lori [name] been given the opportunity to ask questions and make suggestions.  Lenny Pastel, RN 09/18/2023, 5:45 PM

## 2023-09-18 NOTE — ED Notes (Signed)
Pt was provided with lunch tray. Pt sitting up eating.

## 2023-09-18 NOTE — Group Note (Signed)
Date:  09/18/2023 Time:  10:26 PM  Group Topic/Focus:  Making Healthy Choices:   The focus of this group is to help patients identify negative/unhealthy choices they were using prior to admission and identify positive/healthier coping strategies to replace them upon discharge.    Participation Level:  Active  Participation Quality:  Appropriate  Affect:  Appropriate  Cognitive:  Appropriate  Insight: Good  Engagement in Group:  Engaged  Modes of Intervention:  Discussion  Additional Comments:    Maeola Harman 09/18/2023, 10:26 PM

## 2023-09-18 NOTE — Plan of Care (Signed)
  Problem: Education: Goal: Knowledge of General Education information will improve Description: Including pain rating scale, medication(s)/side effects and non-pharmacologic comfort measures Outcome: Progressing   Problem: Education: Goal: Will be free of psychotic symptoms Outcome: Not Progressing Goal: Knowledge of the prescribed therapeutic regimen will improve Outcome: Not Progressing

## 2023-09-18 NOTE — BH Assessment (Signed)
Patient is to be admitted to [facility] Psych Unit today 09/18/23 after 12:30pm by Dr.  Marlou Porch .  Attending Physician will be Dr. Marlou Porch.   Patient has been assigned to room L 31, by [facility] Charge Nurse Linsey.    ER staff is aware of the admission: Nitchia, ER Secretary   Dr. Roxan Hockey, ER MD  Zach/Ashley, Patient's Nurse  Marianne Sofia, Patient Access.

## 2023-09-18 NOTE — Progress Notes (Signed)
Pt is confused and manic. Affect flat/mood congruent. Patient  denies Anxiety/depression  SI/HI/AVH, no pain at this time. Patient reports hallucinations which she describes them as visions. She sates " UFO's  are real and they placed a device in my ear , where they communicated with me and provided me  with guidance for my problems. They asked me to call them God".    Scheduled medications administered to patient per MD orders. Support and encouragement provided . Routine safety checks conducted every 15 minutes without  incident. . Patient informed to notify staff with problems or concerns and verbalize understanding.      No adverse drug reaction noted. Pt compliant with medications  and treatment plan. Pt receptive calm, cooperative and interact well with others on the unit. Pt contracts for safety and remains safe on the unit at this time.

## 2023-09-18 NOTE — Progress Notes (Signed)
 Admission Note:  Lori [name] was admitted under IVC from [facility] emergency room.  Patient was placed under IVC by her family due to concerns of mania and responding to internal stimuli.  Patient has anxious affect and is unable to sit still.  Speech is rapid and tangential. Patient had difficulty staying on topic and answering assessment questions appropriately.  Patient states, "I talk to my visions and  people don't understand."  Patient spoke of UFOs and an upcoming war that will kill 8 billion people.  Endorses anxiety and depression as a result of her visions.  Denies SI/HI.   Pain rated 6/10 "all over body."    Patient reports she lives with her ex husband Thayer Ohm) and an elderly man named Molly Maduro. Also has a daughter in college.  Patient reports she only drinks alcohol "once in a blue moon."  Smokes 10-15 cigarettes a day.    Skin assessment completed with Elexis, MHT.  No contraband found.  Patient oriented to unit policies.  Questions and concerns addressed.  Lunch tray provided. Patient given supplies and has showered.

## 2023-09-18 NOTE — Group Note (Signed)
Date:  09/18/2023 Time:  11:17 AM  Group Topic/Focus:  Coping With Mental Health Crisis:   The purpose of this group is to help patients identify strategies for coping with mental health crisis.  Group discusses possible causes of crisis and ways to manage them effectively. Goals Group:   The focus of this group is to help patients establish daily goals to achieve during treatment and discuss how the patient can incorporate goal setting into their daily lives to aide in recovery. Healthy Communication:   The focus of this group is to discuss communication, barriers to communication, as well as healthy ways to communicate with others.    Participation Level:  Did Not Attend  Participation Quality:    Affect:    Cognitive:   Insight:   Engagement in Group:    Modes of Intervention:    Additional Comments:    Toriana Sponsel L Alvan Culpepper 09/18/2023, 11:17 AM

## 2023-09-18 NOTE — ED Provider Notes (Signed)
Emergency Medicine Observation Re-evaluation Note  Lori [name] is a 62 y.o. female, seen on rounds today.  Pt initially presented to the ED for complaints of IVC  and Psychiatric Evaluation (/)  Currently, the patient is is no acute distress. Denies any concerns at this time.  Physical Exam  Blood pressure 110/77, pulse 89, temperature 98.2 F (36.8 C), temperature source Oral, resp. rate 18, height 5\' 7"  (1.702 m), weight 63.5 kg, SpO2 98%.  Physical Exam: General: No apparent distress Pulm: Normal WOB Neuro: Moving all extremities Psych: Resting comfortably     ED Course / MDM     I have reviewed the labs performed to date as well as medications administered while in observation.  Recent changes in the last 24 hours include: No acute events overnight.  Plan   Current plan: Patient awaiting psychiatric disposition. Patient is not under full IVC at this time.    Shaquandra Galano, Layla Maw, DO 09/18/23 [phone]

## 2023-09-19 DIAGNOSIS — F29 Unspecified psychosis not due to a substance or known physiological condition: Secondary | ICD-10-CM | POA: Diagnosis not present

## 2023-09-19 MED ORDER — NICOTINE 14 MG/24HR TD PT24
14.0000 mg | MEDICATED_PATCH | Freq: Every day | TRANSDERMAL | Status: DC
  Administered 2023-09-19 – 2023-09-20 (×2): 14 mg via TRANSDERMAL
  Filled 2023-09-19 (×2): qty 1

## 2023-09-19 MED ORDER — QUETIAPINE FUMARATE 200 MG PO TABS
400.0000 mg | ORAL_TABLET | Freq: Every day | ORAL | Status: DC
  Administered 2023-09-19 – 2023-09-20 (×2): 400 mg via ORAL
  Filled 2023-09-19 (×2): qty 2

## 2023-09-19 MED ORDER — LORAZEPAM 0.5 MG PO TABS
0.5000 mg | ORAL_TABLET | ORAL | Status: DC | PRN
  Administered 2023-09-19 – 2023-09-26 (×10): 0.5 mg via ORAL
  Filled 2023-09-19 (×10): qty 1

## 2023-09-19 MED ORDER — PERPHENAZINE 4 MG PO TABS
8.0000 mg | ORAL_TABLET | Freq: Three times a day (TID) | ORAL | Status: DC
  Administered 2023-09-19 – 2023-09-20 (×4): 8 mg via ORAL
  Filled 2023-09-19 (×6): qty 2

## 2023-09-19 NOTE — Group Note (Signed)
Recreation Therapy Group Note   Group Topic:General Recreation  Group Date: 09/19/2023 Start Time: 1400 End Time: 1500 Facilitators: Rosina Lowenstein, LRT, CTRS Location: Courtyard  Group Description: [employer]. LRT and patients played games of basketball, drew with chalk, and played corn hole while outside in the courtyard while getting fresh air and sunlight. Music was being played in the background. LRT and peers conversed about different games they have played before, what they do in their free time and anything else that is on their minds. LRT encouraged pts to drink water after being outside, sweating and getting their heart rate up.  Goal Area(s) Addressed: Patient will build on frustration tolerance skills. Patients will partake in a competitive play game with peers. Patients will gain knowledge of new leisure interest/hobby.    Affect/Mood: Appropriate and Flat   Participation Level: Active   Participation Quality: Independent   Behavior: Calm and Cooperative   Speech/Thought Process: Coherent   Insight: Fair   Judgement: Fair    Modes of Intervention: Activity and Music   Patient Response to Interventions:  Receptive   Education Outcome:  In group clarification offered    Clinical Observations/Individualized Feedback: Lori [name] was active in their participation of session activities and group discussion. Pt suggested songs for the group to hear. Pt minimally interacted with LRT and peers duration of session.    Plan: Continue to engage patient in RT group sessions 2-3x/week.   Rosina Lowenstein, LRT, CTRS 09/19/2023 3:26 PM

## 2023-09-19 NOTE — Plan of Care (Signed)
  Problem: Education: Goal: Knowledge of General Education information will improve Description: Including pain rating scale, medication(s)/side effects and non-pharmacologic comfort measures Outcome: Not Progressing   Problem: Health Behavior/Discharge Planning: Goal: Ability to manage health-related needs will improve Outcome: Not Progressing   

## 2023-09-19 NOTE — Progress Notes (Signed)
Patient admitted IVC to [facility] on 11.24 after reports of manic behavior and experiencing hallucinations.  Today patient appears less manic but is restless and preoccupied. She denies SI/HI/AVH. Patient endorses anxiety but denies depression. Patient participated in treatment team and was articulate. Pain 10/10 generalized. Oxycodone 5 mg adm at 0943 and 1607 Upon follow up, pain level decreased after both adm. New order for Ativan 0.5 mg q4h PRN for anxiety.   Q15 minute unit checks in place.

## 2023-09-19 NOTE — Group Note (Unsigned)
Date:  09/20/2023 Time:  12:11 AM  Group Topic/Focus:  Coping With Mental Health Crisis:   The purpose of this group is to help patients identify strategies for coping with mental health crisis.  Group discusses possible causes of crisis and ways to manage them effectively.    Participation Level:  Did Not Attend  Participation Quality:      Affect:      Cognitive:      Insight: None  Engagement in Group:  None  Modes of Intervention:      Additional Comments:    Lori [name] 09/20/2023, 12:11 AM
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
## 2023-09-19 NOTE — Group Note (Signed)
Date:  09/19/2023 Time:  10:46 AM  Group Topic/Focus:  Coping With Mental Health Crisis:   The purpose of this group is to help patients identify strategies for coping with mental health crisis.  Group discusses possible causes of crisis and ways to manage them effectively.    Participation Level:  Active  Participation Quality:  Attentive  Affect:  Appropriate  Cognitive:  Appropriate  Insight: Good  Engagement in Group:  Engaged  Modes of Intervention:  Discussion   Ardelle Anton 09/19/2023, 10:46 AM

## 2023-09-19 NOTE — Plan of Care (Signed)
  Problem: Education: Goal: Knowledge of General Education information will improve Description: Including pain rating scale, medication(s)/side effects and non-pharmacologic comfort measures Outcome: Progressing   Problem: Health Behavior/Discharge Planning: Goal: Ability to manage health-related needs will improve Outcome: Progressing   Problem: Coping: Goal: Level of anxiety will decrease Outcome: Progressing   Problem: Safety: Goal: Ability to remain free from injury will improve Outcome: Progressing   Problem: Skin Integrity: Goal: Risk for impaired skin integrity will decrease Outcome: Progressing   Problem: Education: Goal: Will be free of psychotic symptoms Outcome: Progressing Goal: Knowledge of the prescribed therapeutic regimen will improve Outcome: Progressing   Problem: Coping: Goal: Coping ability will improve Outcome: Progressing Goal: Will verbalize feelings Outcome: Progressing   Problem: Self-Concept: Goal: Will verbalize positive feelings about self Outcome: Progressing

## 2023-09-19 NOTE — BH IP Treatment Plan (Signed)
Interdisciplinary Treatment and Diagnostic Plan Update  09/19/2023 Time of Session: 9:53 AM  Lori [name] MRN: [id number]  Principal Diagnosis: Psychosis, unspecified psychosis type (HCC)  Secondary Diagnoses: Principal Problem:   Psychosis, unspecified psychosis type (HCC)   Current Medications:  Current Facility-Administered Medications  Medication Dose Route Frequency Provider Last Rate Last Admin   alum & mag hydroxide-simeth (MAALOX/MYLANTA) 200-200-20 MG/5ML suspension 30 mL  30 mL Oral Q4H PRN Ophelia Shoulder E, NP       diphenhydrAMINE (BENADRYL) capsule 50 mg  50 mg Oral QHS PRN Jearld Lesch, NP   50 mg at 09/18/23 2222   estrogens (conjugated) (PREMARIN) tablet 0.625 mg  0.625 mg Oral Daily Ophelia Shoulder E, NP   0.625 mg at 09/19/23 0942   ezetimibe (ZETIA) tablet 10 mg  10 mg Oral Daily Ophelia Shoulder E, NP   10 mg at 09/19/23 0865   gabapentin (NEURONTIN) capsule 200 mg  200 mg Oral BID Ophelia Shoulder E, NP   200 mg at 09/19/23 0943   lisinopril (ZESTRIL) tablet 10 mg  10 mg Oral Daily Ophelia Shoulder E, NP   10 mg at 09/19/23 0942   magnesium hydroxide (MILK OF MAGNESIA) suspension 30 mL  30 mL Oral Daily PRN Chales Abrahams, NP       melatonin tablet 10 mg  10 mg Oral QHS Dixon, Rashaun M, NP   10 mg at 09/18/23 2234   OLANZapine (ZYPREXA) tablet 10 mg  10 mg Oral BID Ophelia Shoulder E, NP   10 mg at 09/19/23 0943   OLANZapine zydis (ZYPREXA) disintegrating tablet 10 mg  10 mg Oral Q8H PRN Ophelia Shoulder E, NP       And   ziprasidone (GEODON) injection 20 mg  20 mg Intramuscular PRN Chales Abrahams, NP       OXcarbazepine (TRILEPTAL) tablet 150 mg  150 mg Oral BID Ophelia Shoulder E, NP   150 mg at 09/19/23 7846   oxyCODONE (Oxy IR/ROXICODONE) immediate release tablet 5 mg  5 mg Oral QID PRN Ophelia Shoulder E, NP   5 mg at 09/19/23 9629   perphenazine (TRILAFON) tablet 4 mg  4 mg Oral TID Chales Abrahams, NP   4 mg at 09/19/23 5284   PTA Medications: Medications Prior to  Admission  Medication Sig Dispense Refill Last Dose   ezetimibe (ZETIA) 10 MG tablet Take 1 tablet by mouth daily.      gabapentin (NEURONTIN) 100 MG capsule Take 200 mg by mouth 2 (two) times daily. (Patient not taking: Reported on 07/03/2023)      lisinopril (ZESTRIL) 5 MG tablet Take 10 mg by mouth daily.      OLANZapine (ZYPREXA) 15 MG tablet Take 15 mg by mouth 2 (two) times daily.      Oxcarbazepine (TRILEPTAL) 300 MG tablet Take 300 mg by mouth 2 (two) times daily.      oxyCODONE (OXY IR/ROXICODONE) 5 MG immediate release tablet Take 5 mg by mouth 4 (four) times daily as needed.      perphenazine (TRILAFON) 4 MG tablet Take 4 mg by mouth at 9 AM, 3 PM, and 9 PM for psychosis.      pravastatin (PRAVACHOL) 20 MG tablet TAKE 1 TABLET(20 MG) BY MOUTH AT BEDTIME (Patient not taking: Reported on 04/18/2022) 90 tablet 3    PREMARIN 0.625 MG tablet TAKE 1 TABLET BY MOUTH EVERY DAY 30 tablet 1    sertraline (ZOLOFT) 50 MG tablet Take 2 tablets (  100 mg total) by mouth daily. (Patient not taking: Reported on 04/18/2022) 180 tablet 1    traMADol (ULTRAM) 50 MG tablet TAKE 1 TABLET(50 MG) BY MOUTH EVERY 8 HOURS AS NEEDED 90 tablet 0    traZODone (DESYREL) 50 MG tablet Take 50 mg by mouth at bedtime as needed.       Patient Stressors: Marital or family conflict    Patient Strengths: Capable of independent living  Physical Health   Treatment Modalities: Medication Management, Group therapy, Case management,  1 to 1 session with clinician, Psychoeducation, Recreational therapy.   Physician Treatment Plan for Primary Diagnosis: Psychosis, unspecified psychosis type (HCC) Long Term Goal(s):     Short Term Goals:    Medication Management: Evaluate patient's response, side effects, and tolerance of medication regimen.  Therapeutic Interventions: 1 to 1 sessions, Unit Group sessions and Medication administration.  Evaluation of Outcomes: Not Progressing  Physician Treatment Plan for Secondary  Diagnosis: Principal Problem:   Psychosis, unspecified psychosis type (HCC)  Long Term Goal(s):     Short Term Goals:       Medication Management: Evaluate patient's response, side effects, and tolerance of medication regimen.  Therapeutic Interventions: 1 to 1 sessions, Unit Group sessions and Medication administration.  Evaluation of Outcomes: Not Progressing   RN Treatment Plan for Primary Diagnosis: Psychosis, unspecified psychosis type (HCC) Long Term Goal(s): Knowledge of disease and therapeutic regimen to maintain health will improve  Short Term Goals: Ability to remain free from injury will improve, Ability to verbalize frustration and anger appropriately will improve, Ability to demonstrate self-control, Ability to participate in decision making will improve, Ability to verbalize feelings will improve, Ability to disclose and discuss suicidal ideas, Ability to identify and develop effective coping behaviors will improve, and Compliance with prescribed medications will improve  Medication Management: RN will administer medications as ordered by provider, will assess and evaluate patient's response and provide education to patient for prescribed medication. RN will report any adverse and/or side effects to prescribing provider.  Therapeutic Interventions: 1 on 1 counseling sessions, Psychoeducation, Medication administration, Evaluate responses to treatment, Monitor vital signs and CBGs as ordered, Perform/monitor CIWA, COWS, AIMS and Fall Risk screenings as ordered, Perform wound care treatments as ordered.  Evaluation of Outcomes: Not Progressing   LCSW Treatment Plan for Primary Diagnosis: Psychosis, unspecified psychosis type (HCC) Long Term Goal(s): Safe transition to appropriate next level of care at discharge, Engage patient in therapeutic group addressing interpersonal concerns.  Short Term Goals: Engage patient in aftercare planning with referrals and resources, Increase  social support, Increase ability to appropriately verbalize feelings, Increase emotional regulation, Facilitate acceptance of mental health diagnosis and concerns, Facilitate patient progression through stages of change regarding substance use diagnoses and concerns, Identify triggers associated with mental health/substance abuse issues, and Increase skills for wellness and recovery  Therapeutic Interventions: Assess for all discharge needs, 1 to 1 time with Social worker, Explore available resources and support systems, Assess for adequacy in community support network, Educate family and significant other(s) on suicide prevention, Complete Psychosocial Assessment, Interpersonal group therapy.  Evaluation of Outcomes: Not Progressing   Progress in Treatment: Attending groups: No. Participating in groups: No. Taking medication as prescribed: Yes. Toleration medication: Yes. Family/Significant other contact made: No, will contact:  CSW will contact if given permission  Patient understands diagnosis: No. Discussing patient identified problems/goals with staff: Yes. Medical problems stabilized or resolved: Yes. Denies suicidal/homicidal ideation: Yes. Issues/concerns per patient self-inventory: No. Other: None   New  problem(s) identified: No, Describe:  None identified   New Short Term/Long Term Goal(s): elimination of symptoms of psychosis, medication management for mood stabilization; elimination of SI thoughts; development of comprehensive mental wellness plan.   Patient Goals:  "To realize that there are millions of people that are in this"   Discharge Plan or Barriers: CSW will assist with appropriate discharge planning   Reason for Continuation of Hospitalization: Delusions  Mania Medication stabilization  Estimated Length of Stay: 1 to 7 days   Last 3 [place] Suicide Severity Risk Score: Flowsheet Row Admission (Current) from 09/18/2023 in [facility] ED  from 09/15/2023 in [facility] Emergency Department at [facility] ED from 07/02/2023 in [facility] Emergency Department at [facility]  C-SSRS RISK CATEGORY No Risk No Risk No Risk       Last PHQ 2/9 Scores:    01/24/2019    9:27 AM 10/27/2018    2:47 PM 08/28/2018    2:22 PM  Depression screen PHQ 2/9  Decreased Interest 0 0 0  Down, Depressed, Hopeless 1 1 0  PHQ - 2 Score 1 1 0  Altered sleeping 0 1 0  Tired, decreased energy 0 0 1  Change in appetite 0 0 0  Feeling bad or failure about yourself  1 0 0  Trouble concentrating 0 0 0  Moving slowly or fidgety/restless 0 0 0  Suicidal thoughts 0 0 0  PHQ-9 Score 2 2 1   Difficult doing work/chores Somewhat difficult Not difficult at all Not difficult at all    Scribe for Treatment Team: [name], LCSWA 09/19/2023 10:30 AM

## 2023-09-20 DIAGNOSIS — F29 Unspecified psychosis not due to a substance or known physiological condition: Secondary | ICD-10-CM | POA: Diagnosis not present

## 2023-09-20 MED ORDER — NICOTINE POLACRILEX 2 MG MT GUM
2.0000 mg | CHEWING_GUM | OROMUCOSAL | Status: DC | PRN
  Administered 2023-09-20 – 2023-09-27 (×14): 2 mg via ORAL
  Filled 2023-09-20 (×15): qty 1

## 2023-09-20 MED ORDER — PERPHENAZINE 4 MG PO TABS
8.0000 mg | ORAL_TABLET | Freq: Two times a day (BID) | ORAL | Status: DC
  Administered 2023-09-21: 8 mg via ORAL
  Filled 2023-09-20: qty 2

## 2023-09-20 NOTE — Group Note (Signed)
Date:  09/20/2023 Time:  10:46 AM  Group Topic/Focus:  Coping With Mental Health Crisis:   The purpose of this group is to help patients identify strategies for coping with mental health crisis.  Group discusses possible causes of crisis and ways to manage them effectively. Goals Group:   The focus of this group is to help patients establish daily goals to achieve during treatment and discuss how the patient can incorporate goal setting into their daily lives to aide in recovery. Healthy Communication:   The focus of this group is to discuss communication, barriers to communication, as well as healthy ways to communicate with others. Personal Development- We focused on ways to better cope with stressors and challenges.    Participation Level:  Active  Participation Quality:  Appropriate  Affect:  Appropriate  Cognitive:  Appropriate  Insight: Appropriate  Engagement in Group:  Engaged  Modes of Intervention:  Discussion  Additional Comments:    Lori [name] 09/20/2023, 10:46 AM

## 2023-09-20 NOTE — Group Note (Signed)
Recreation Therapy Group Note   Group Topic:Stress Management  Group Date: 09/20/2023 Start Time: 1400 End Time: 1450 Facilitators: Rosina Lowenstein, LRT, CTRS Location: Courtyard  Group Description: Meditation. LRT and patients discussed what they know about meditation and mindfulness. LRT played a Deep Breathing Meditation exercise script for patients to follow along to. LRT and patients discussed how meditation and deep breathing can be used as a coping skill post--discharge to help manage symptoms of stress.  Goal Area(s) Addressed: Patient will practice using relaxation technique. Patient will identify a new coping skill.  Patient will follow multistep directions to reduce anxiety and stress.   Affect/Mood: N/A   Participation Level: Did not attend    Clinical Observations/Individualized Feedback: Lori [name] briefly thought about attending group before sharing: "Not today, today's not my day".   Plan: Continue to engage patient in RT group sessions 2-3x/week.   Rosina Lowenstein, LRT, CTRS 09/20/2023 2:57 PM

## 2023-09-20 NOTE — Group Note (Signed)
[facility] LCSW Group Therapy Note   Group Date: 09/20/2023 Start Time: 1300 End Time: 1400  Type of Therapy/Topic:  Group Therapy:  Feelings about Diagnosis  Participation Level:  Minimal   Mood: Anxious    Description of Group:    This group will allow patients to explore their thoughts and feelings about diagnoses they have received. Patients will be guided to explore their level of understanding and acceptance of these diagnoses. Facilitator will encourage patients to process their thoughts and feelings about the reactions of others to their diagnosis, and will guide patients in identifying ways to discuss their diagnosis with significant others in their lives. This group will be process-oriented, with patients participating in exploration of their own experiences as well as giving and receiving support and challenge from other group members.   Therapeutic Goals: 1. Patient will demonstrate understanding of diagnosis as evidence by identifying two or more symptoms of the disorder:  2. Patient will be able to express two feelings regarding the diagnosis 3. Patient will demonstrate ability to communicate their needs through discussion and/or role plays  Summary of Patient Progress:    Pt was anxious throughout group and was in and out of the group room    Therapeutic Modalities:   Cognitive Behavioral Therapy Brief Therapy Feelings Identification    [name], LCSWA

## 2023-09-20 NOTE — Group Note (Signed)
Date:  09/20/2023 Time:  10:23 PM  Group Topic/Focus:  Overcoming Stress:   The focus of this group is to define stress and help patients assess their triggers. Personal Choices and Values:   The focus of this group is to help patients assess and explore the importance of values in their lives, how their values affect their decisions, how they express their values and what opposes their expression.    Participation Level:  Minimal  Participation Quality:  Redirectable  Affect:  Anxious  Cognitive:  Delusional  Insight: Good  Engagement in Group:  Off Topic  Modes of Intervention:  Discussion  Additional Comments:    Lori [name] 09/20/2023, 10:23 PM

## 2023-09-20 NOTE — Progress Notes (Signed)
   09/20/23 1504  Psych Admission Type (Psych Patients Only)  Admission Status Involuntary  Psychosocial Assessment  Patient Complaints Anxiety;Worrying  Eye Contact Brief  Facial Expression Anxious  Affect Anxious  Speech Soft  Interaction Assertive  Motor Activity Slow  Appearance/Hygiene In scrubs  Behavior Characteristics Pacing  Mood Preoccupied  Thought Process  Coherency Disorganized  Content Preoccupation  Delusions Referential  Perception Derealization  Hallucination None reported or observed  Judgment Impaired  Confusion Mild  Danger to Self  Current suicidal ideation? Denies  Danger to Others  Danger to Others None reported or observed

## 2023-09-20 NOTE — Progress Notes (Signed)
Patient is alert & oriented x 3.  It is not clear if she knows why she is here.  This writer attempted to explain, but she began rambling.  She denied SI, HI, hallucination, and delusions.  She is complaint with her medications.  She ambulated in the hall without difficulty. Wrapped in a blanket.  She c/o of pain in her right leg 8/10.  She also c/o anxiety.  She was medicated with PRN Ativan & Oxycodone.  When reassessed she was resting in bed with closed eye.  Respirations throughout the shift unlabored and even.  Q8m safety checks completed as per protocol.

## 2023-09-20 NOTE — BH Assessment (Signed)
Recreation Therapy Notes  INPATIENT RECREATION THERAPY ASSESSMENT  Patient Details Name: RIANNE BASARA MRN: [id number] DOB: November 23, 1960 Today's Date: 09/20/2023  Able to Participate in Assessment/Interview:  No  Arnulfo Batson E Generoso Cropper 09/20/2023, 7:59 AM
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
## 2023-09-21 DIAGNOSIS — F29 Unspecified psychosis not due to a substance or known physiological condition: Secondary | ICD-10-CM | POA: Diagnosis not present

## 2023-09-21 MED ORDER — OXCARBAZEPINE 300 MG PO TABS
300.0000 mg | ORAL_TABLET | Freq: Two times a day (BID) | ORAL | Status: DC
  Administered 2023-09-21 – 2023-09-28 (×14): 300 mg via ORAL
  Filled 2023-09-21 (×14): qty 1

## 2023-09-21 MED ORDER — QUETIAPINE FUMARATE 200 MG PO TABS
600.0000 mg | ORAL_TABLET | Freq: Every day | ORAL | Status: DC
  Administered 2023-09-21 – 2023-09-27 (×7): 600 mg via ORAL
  Filled 2023-09-21 (×7): qty 3

## 2023-09-21 MED ORDER — QUETIAPINE FUMARATE 100 MG PO TABS
100.0000 mg | ORAL_TABLET | Freq: Every day | ORAL | Status: DC
  Administered 2023-09-21 – 2023-09-28 (×8): 100 mg via ORAL
  Filled 2023-09-21 (×8): qty 1

## 2023-09-21 NOTE — Group Note (Signed)
Recreation Therapy Group Note   Group Topic:Emotion Expression  Group Date: 09/21/2023 Start Time: 1345 End Time: 1445 Facilitators: Rosina Lowenstein, LRT, CTRS Location:  Dayroom  Group Description: Gratitude Journaling. Patients and LRT discussed what gratitude means, how we can express it and what it means to [place], personally. LRT gave an education handout on the definition of gratitude that also gave different examples of gratitude exercises that they could try. One of the examples was "Gratitude Letter", which prompted patient to write a letter to someone they appreciate. LRT played soft music while everyone wrote their letter. Once letter was completed, LRT encouraged people to read their letter if they wanted to; or share who they wrote it to, at minimum. LRT and pts talked about the benefits of journaling and how it can be used as a positive coping skill. LRT and pts processed how showing gratitude towards themselves, and others can be applied to everyday life post-discharge. LRT offered journals to pts afterwards.   Goal Area(s) Addressed:  Patient will identify the definition of gratitude. Patient will learn different gratitude exercises. Patient will practice writing/journaling as a coping skill.  Patient will identify a new coping skill.   Affect/Mood: Full range   Participation Level: Hyperverbal   Participation Quality: Minimal Cues   Behavior: Bizarre   Speech/Thought Process: Delusional   Insight: Limited   Judgement: Limited   Modes of Intervention: Activity, Education, and Writing   Patient Response to Interventions:  Receptive and Requested additional information/resources    Education Outcome:  In group clarification offered    Clinical Observations/Individualized Feedback: Haeley was somewhat active in their participation of session activities and group discussion. Pt identified "to my cousin who taught me how to fight". Pt was talking about many different  things such as UFO, witches, Elvis Presley language, bolts in her ear, etc. However, pt is pleasant and receptive to redirection.    Plan: Continue to engage patient in RT group sessions 2-3x/week.   Rosina Lowenstein, LRT, CTRS 09/21/2023 2:59 PM
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
## 2023-09-21 NOTE — Plan of Care (Signed)
  Problem: Education: Goal: Knowledge of General Education information will improve Description: Including pain rating scale, medication(s)/side effects and non-pharmacologic comfort measures Outcome: Not Progressing   Problem: Health Behavior/Discharge Planning: Goal: Ability to manage health-related needs will improve Outcome: Not Progressing   Problem: Coping: Goal: Level of anxiety will decrease Outcome: Not Progressing   Problem: Safety: Goal: Ability to remain free from injury will improve Outcome: Not Progressing   Problem: Skin Integrity: Goal: Risk for impaired skin integrity will decrease Outcome: Not Progressing   Problem: Education: Goal: Will be free of psychotic symptoms Outcome: Not Progressing Goal: Knowledge of the prescribed therapeutic regimen will improve Outcome: Not Progressing   Problem: Coping: Goal: Coping ability will improve Outcome: Not Progressing Goal: Will verbalize feelings Outcome: Not Progressing   Problem: Self-Concept: Goal: Will verbalize positive feelings about self Outcome: Not Progressing

## 2023-09-21 NOTE — Group Note (Unsigned)
Date:  09/21/2023 Time:  11:02 AM  Group Topic/Focus:  Socialize / Activity     Participation Level:  {BHH PARTICIPATION ZOXWR:60454}  Participation Quality:  {BHH PARTICIPATION QUALITY:22265}  Affect:  {BHH AFFECT:22266}  Cognitive:  {BHH COGNITIVE:22267}  Insight: {BHH Insight2:20797}  Engagement in Group:  {BHH ENGAGEMENT IN UJWJX:91478}  Modes of Intervention:  {BHH MODES OF INTERVENTION:22269}  Additional Comments:  ***  Rodena Goldmann 09/21/2023, 11:02 AM

## 2023-09-21 NOTE — BHH Suicide Risk Assessment (Signed)
B[facility] INPATIENT:  Family/Significant Other Suicide Prevention Education  Suicide Prevention Education:  Patient Refusal for Family/Significant Other Suicide Prevention Education: The patient Lori [name] has refused to provide written consent for family/significant other to be provided Family/Significant Other Suicide Prevention Education during admission and/or prior to discharge.  Physician notified.  [name] 09/21/2023, 1:22 PM

## 2023-09-21 NOTE — Progress Notes (Signed)
   09/21/23 1700  Psych Admission Type (Psych Patients Only)  Admission Status Involuntary  Psychosocial Assessment  Patient Complaints Anxiety  Eye Contact Brief  Facial Expression Anxious  Affect Anxious  Speech Soft  Interaction Assertive  Motor Activity Slow  Appearance/Hygiene In scrubs  Behavior Characteristics Pacing  Mood Preoccupied  Thought Process  Coherency Disorganized  Content Preoccupation  Delusions Referential  Perception Derealization  Hallucination None reported or observed  Judgment Impaired  Confusion Mild  Danger to Self  Current suicidal ideation? Denies  Danger to Others  Danger to Others None reported or observed

## 2023-09-21 NOTE — Group Note (Unsigned)
Date:  09/21/2023 Time:  11:04 AM  Group Topic/Focus:  Socialize /  Activity     Participation Level:  {B[facility] PARTICIPATION VWUJW:11914}  Participation Quality:  {B[facility] PARTICIPATION QUALITY:22265}  Affect:  {BHH AFFECT:22266}  Cognitive:  {BHH COGNITIVE:22267}  Insight: {BHH Insight2:20797}  Engagement in Group:  {B[facility] ENGAGEMENT IN NWGNF:62130}  Modes of Intervention:  {BHH MODES OF INTERVENTION:22269}  Additional Comments:  ***  Rodena Goldmann 09/21/2023, 11:04 AM

## 2023-09-21 NOTE — Group Note (Signed)
Date:  09/21/2023 Time:  11:09 AM  Group Topic/Focus:  Socialize / Activity    Participation Level:  Did Not Attend    Rodena Goldmann 09/21/2023, 11:09 AM

## 2023-09-22 DIAGNOSIS — F29 Unspecified psychosis not due to a substance or known physiological condition: Secondary | ICD-10-CM | POA: Diagnosis not present

## 2023-09-22 NOTE — Plan of Care (Signed)
  Problem: Coping: Goal: Coping ability will improve Outcome: Progressing   Problem: Education: Goal: Will be free of psychotic symptoms Outcome: Not Progressing

## 2023-09-22 NOTE — Group Note (Signed)
Date:  09/22/2023 Time:  9:37 PM  Group Topic/Focus:  Self Care:   The focus of this group is to help patients understand the importance of self-care in order to improve or restore emotional, physical, spiritual, interpersonal, and financial health.    Participation Level:  Active  Participation Quality:  Appropriate  Affect:  Appropriate  Cognitive:  Appropriate  Insight: Appropriate  Engagement in Group:  Engaged  Modes of Intervention:  Education  Additional Comments:    Garry Heater 09/22/2023, 9:37 PM
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
## 2023-09-22 NOTE — Progress Notes (Signed)
 Patient presents anxious and confused.  Pleasant with staff.  Patient is difficult to assess due to confusion.  Denies SI/HI.  Continues to speak about her "visions."  Disorganized speech. Pain rated 7/10  in knees/ legs.  PRN medication for pain given.    Compliant with scheduled medications.  15 min checks in place for safety. Patient is present in the milieu.  Minimal interaction with peers.  Patient is able to express her needs to staff.

## 2023-09-22 NOTE — Progress Notes (Signed)
   09/21/23 2300  Psych Admission Type (Psych Patients Only)  Admission Status Involuntary  Psychosocial Assessment  Patient Complaints Anxiety;Confusion  Eye Contact Brief  Facial Expression Anxious  Affect Anxious  Speech Soft  Interaction Guarded  Motor Activity Slow  Appearance/Hygiene In scrubs  Behavior Characteristics Anxious;Pacing  Mood Preoccupied  Thought Process  Coherency Flight of ideas;Disorganized  Content Preoccupation  Delusions Religious;Paranoid  Perception Derealization  Hallucination None reported or observed  Judgment Impaired  Confusion Moderate  Danger to Self  Current suicidal ideation? Denies  Agreement Not to Harm Self Yes  Description of Agreement verbal  Danger to Others  Danger to Others None reported or observed

## 2023-09-22 NOTE — Progress Notes (Signed)
   09/22/23 0600  15 Minute Checks  Location Bedroom  Visual Appearance Calm  Behavior Composed  Sleep (Behavioral Health Patients Only)  Calculate sleep? (Click Yes once per 24 hr at 0600 safety check) Yes  Documented sleep last 24 hours 4

## 2023-09-23 DIAGNOSIS — F29 Unspecified psychosis not due to a substance or known physiological condition: Secondary | ICD-10-CM | POA: Diagnosis not present

## 2023-09-23 NOTE — Progress Notes (Signed)
 Patient is pleasant and cooperative.  Anxious affect. Confused and disoriented to situation. Denies SI/HI and AVH.  Continues to speak about a upcoming war and the many people that will be killed. Pain 7/10 in legs.    Compliant with scheduled medication.  15 min checks in place for safety.  Patient is present in the milieu, often pacing.  Minimal interaction with  peers.  Able to express her needs to staff.    Nicorette gum given x 3.  PRN pain medication given as ordered x 1.

## 2023-09-23 NOTE — Progress Notes (Signed)
   09/23/23 0630  15 Minute Checks  Location Dayroom  Visual Appearance Calm  Behavior Composed  Sleep (Behavioral Health Patients Only)  Calculate sleep? (Click Yes once per 24 hr at 0600 safety check) Yes  Documented sleep last 24 hours 5

## 2023-09-23 NOTE — Group Note (Signed)
Date:  09/23/2023 Time:  3:03 PM  Group Topic/Focus:  Overcoming Stress:   The focus of this group is to define stress and help patients assess their triggers.    Participation Level:  Active  Participation Quality:  Appropriate  Affect:  Appropriate  Cognitive:  Appropriate  Insight: Appropriate  Engagement in Group:  Engaged  Modes of Intervention:  Activity  Additional Comments:     Alexis Frock 09/23/2023, 3:03 PM

## 2023-09-23 NOTE — Progress Notes (Addendum)
  Pt is alert but disoriented about situation . Her affect is bright and her mood is congruent . Pt reports anxiety , no depression . Patient reports pain all over her body which she rates at a 7/10. Denies  SI/HI/AVH states .   Scheduled medications administered to patient per MD orders . Support and encouragement  provided . Routine safety checks conducted every 15 minutes without incident . Patient informed to notify staff with problems or concerns and verbalizes understanding.    No adverse drug noted . Pt compliant with medications and treatment plan . Pt receptive, calm, cooperative and interacts well with others on the unit. Pt contracts for safety and remains safe on the unit at this time.
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
## 2023-09-23 NOTE — [facility] Counselor (Signed)
CSW was given signed consent to release information form from nurse, Amy Boisvert giving permission to contact her ex-husband Gracin Abramyan for collateral information.  CSW unable to reach, unable to leave VM requesting return call.   Reynaldo Minium, MSW, [place] 09/23/2023 3:32 PM

## 2023-09-23 NOTE — Group Note (Signed)
Date:  09/23/2023 Time:  8:37 PM  Group Topic/Focus:  Goals Group:   The focus of this group is to help patients establish daily goals to achieve during treatment and discuss how the patient can incorporate goal setting into their daily lives to aide in recovery.    Participation Level:  Active  Participation Quality:  Appropriate  Affect:  Appropriate  Cognitive:  Appropriate  Insight: Good  Engagement in Group:  Engaged  Modes of Intervention:  Activity and Discussion  Additional Comments:    Lori [name] 09/23/2023, 8:37 PM

## 2023-09-23 NOTE — Plan of Care (Signed)
Pt is disorganized and confused. Tangential with FOI.  Pt unkempt and disheveled. Rates anxiety and depression level 8/10. Pt states she  feels anxious and depressed all the time about her premonitions that 8 million people are going to die. Pt said "I'm not crazy, I see things all the time." Support and encouragement provided.  Po medications given as scheduled. PRNs given for sleep and anxiety. Tol well. Q15 min checks maintained for safety.  Patient remains safe at this time.  Problem: Education: Goal: Will be free of psychotic symptoms Outcome: Progressing Goal: Knowledge of the prescribed therapeutic regimen will improve Outcome: Progressing   Problem: Coping: Goal: Coping ability will improve Outcome: Progressing Goal: Will verbalize feelings Outcome: Progressing   Problem: Self-Concept: Goal: Will verbalize positive feelings about self Outcome: Progressing

## 2023-09-23 NOTE — Plan of Care (Signed)
  Problem: Safety: Goal: Ability to remain free from injury will improve Outcome: Progressing   Problem: Coping: Goal: Coping ability will improve Outcome: Progressing   Problem: Education: Goal: Will be free of psychotic symptoms Outcome: Not Progressing

## 2023-09-24 DIAGNOSIS — F29 Unspecified psychosis not due to a substance or known physiological condition: Secondary | ICD-10-CM | POA: Diagnosis not present

## 2023-09-24 NOTE — Group Note (Signed)
Date:  09/24/2023 Time:  8:11 PM  Group Topic/Focus:  Developing a Wellness Toolbox:   The focus of this group is to help patients develop a "wellness toolbox" with skills and strategies to promote recovery upon discharge.    Participation Level:  Active  Participation Quality:  Appropriate  Affect:  Appropriate  Cognitive:  Appropriate  Insight: Appropriate  Engagement in Group:  Engaged  Modes of Intervention:  Education  Additional Comments:    Garry Heater 09/24/2023, 8:11 PM

## 2023-09-24 NOTE — Group Note (Signed)
Date:  09/24/2023 Time:  2:45 PM  Group Topic/Focus:  Building Self Esteem:   The Focus of this group is helping patients become aware of the effects of self-esteem on their lives, the things they and others do that enhance or undermine their self-esteem, seeing the relationship between their level of self-esteem and the choices they make and learning ways to enhance self-esteem. Overcoming Stress:   The focus of this group is to define stress and help patients assess their triggers.    Participation Level:  Active  Participation Quality:  Appropriate, Attentive, Sharing, and Supportive  Affect:  Appropriate  Cognitive:  Appropriate  Insight: Appropriate  Engagement in Group:  Engaged  Modes of Intervention:  Activity  Additional Comments:     Alexis Frock 09/24/2023, 2:45 PM

## 2023-09-24 NOTE — Plan of Care (Signed)
  Problem: Education: Goal: Knowledge of General Education information will improve Description: Including pain rating scale, medication(s)/side effects and non-pharmacologic comfort measures Outcome: Progressing   Problem: Health Behavior/Discharge Planning: Goal: Ability to manage health-related needs will improve Outcome: Progressing   Problem: Coping: Goal: Level of anxiety will decrease Outcome: Progressing   Problem: Safety: Goal: Ability to remain free from injury will improve Outcome: Progressing   Problem: Skin Integrity: Goal: Risk for impaired skin integrity will decrease Outcome: Progressing   Problem: Education: Goal: Will be free of psychotic symptoms Outcome: Progressing Goal: Knowledge of the prescribed therapeutic regimen will improve Outcome: Progressing   Problem: Coping: Goal: Coping ability will improve Outcome: Progressing Goal: Will verbalize feelings Outcome: Progressing   Problem: Self-Concept: Goal: Will verbalize positive feelings about self Outcome: Progressing

## 2023-09-24 NOTE — Progress Notes (Signed)
Pt is alert but disoriented about situation . Her affect is bright and her mood is congruent . Pt reports anxiety , no depression . Patient reports pain all over her body which she rates at a 7/10. Denies  SI/I. When asked if she was hearing any voices, pt said she had a second sight and she was descendant of Jesus and [place] Merl and that she came from the [place].    Scheduled medications administered to patient per MD orders . Support and encouragement  provided . Routine safety checks conducted every 15 minutes without incident . Patient informed to notify staff with problems or concerns and verbalizes understanding.      No adverse drug noted . Pt compliant with medications and treatment plan . Pt receptive, calm, cooperative and interacts well with others on the unit. Pt contracts for safety and remains safe on the unit at this time.

## 2023-09-24 NOTE — BH IP Treatment Plan (Signed)
Interdisciplinary Treatment and Diagnostic Plan Update  09/24/2023 Time of Session: 11:00 am Lori [name] MRN: [id number]  Principal Diagnosis: Psychosis, unspecified psychosis type [facility])  Secondary Diagnoses: Principal Problem:   Psychosis, unspecified psychosis type (HCC)   Current Medications:  Current Facility-Administered Medications  Medication Dose Route Frequency Provider Last Rate Last Admin   alum & mag hydroxide-simeth (MAALOX/MYLANTA) 200-200-20 MG/5ML suspension 30 mL  30 mL Oral Q4H PRN Chales Abrahams, NP       diphenhydrAMINE (BENADRYL) capsule 50 mg  50 mg Oral QHS PRN Jearld Lesch, NP   50 mg at 09/22/23 2129   estrogens (conjugated) (PREMARIN) tablet 0.625 mg  0.625 mg Oral Daily Ophelia Shoulder E, NP   0.625 mg at 09/24/23 0919   ezetimibe (ZETIA) tablet 10 mg  10 mg Oral Daily Ophelia Shoulder E, NP   10 mg at 09/24/23 0919   gabapentin (NEURONTIN) capsule 200 mg  200 mg Oral BID Ophelia Shoulder E, NP   200 mg at 09/24/23 0918   lisinopril (ZESTRIL) tablet 10 mg  10 mg Oral Daily Ophelia Shoulder E, NP   10 mg at 09/24/23 3244   LORazepam (ATIVAN) tablet 0.5 mg  0.5 mg Oral Q4H PRN Sarina Ill, DO   0.5 mg at 09/22/23 2129   magnesium hydroxide (MILK OF MAGNESIA) suspension 30 mL  30 mL Oral Daily PRN Chales Abrahams, NP       nicotine polacrilex (NICORETTE) gum 2 mg  2 mg Oral PRN Lewanda Rife, MD   2 mg at 09/24/23 0830   OLANZapine zydis (ZYPREXA) disintegrating tablet 10 mg  10 mg Oral Q8H PRN Ophelia Shoulder E, NP       And   ziprasidone (GEODON) injection 20 mg  20 mg Intramuscular PRN Chales Abrahams, NP       Oxcarbazepine (TRILEPTAL) tablet 300 mg  300 mg Oral BID Lewanda Rife, MD   300 mg at 09/24/23 0102   oxyCODONE (Oxy IR/ROXICODONE) immediate release tablet 5 mg  5 mg Oral QID PRN Ophelia Shoulder E, NP   5 mg at 09/24/23 7253   QUEtiapine (SEROQUEL) tablet 100 mg  100 mg Oral Daily Lewanda Rife, MD   100 mg at 09/24/23 6644    QUEtiapine (SEROQUEL) tablet 600 mg  600 mg Oral QHS Lewanda Rife, MD   600 mg at 09/23/23 2103   PTA Medications: Medications Prior to Admission  Medication Sig Dispense Refill Last Dose   ezetimibe (ZETIA) 10 MG tablet Take 1 tablet by mouth daily.      gabapentin (NEURONTIN) 100 MG capsule Take 200 mg by mouth 2 (two) times daily. (Patient not taking: Reported on 07/03/2023)      lisinopril (ZESTRIL) 5 MG tablet Take 10 mg by mouth daily.      OLANZapine (ZYPREXA) 15 MG tablet Take 15 mg by mouth 2 (two) times daily.      Oxcarbazepine (TRILEPTAL) 300 MG tablet Take 300 mg by mouth 2 (two) times daily.      oxyCODONE (OXY IR/ROXICODONE) 5 MG immediate release tablet Take 5 mg by mouth 4 (four) times daily as needed.      perphenazine (TRILAFON) 4 MG tablet Take 4 mg by mouth at 9 AM, 3 PM, and 9 PM for psychosis.      pravastatin (PRAVACHOL) 20 MG tablet TAKE 1 TABLET(20 MG) BY MOUTH AT BEDTIME (Patient not taking: Reported on 04/18/2022) 90 tablet 3    PREMARIN 0.625 MG tablet  TAKE 1 TABLET BY MOUTH EVERY DAY 30 tablet 1    sertraline (ZOLOFT) 50 MG tablet Take 2 tablets (100 mg total) by mouth daily. (Patient not taking: Reported on 04/18/2022) 180 tablet 1    traMADol (ULTRAM) 50 MG tablet TAKE 1 TABLET(50 MG) BY MOUTH EVERY 8 HOURS AS NEEDED 90 tablet 0    traZODone (DESYREL) 50 MG tablet Take 50 mg by mouth at bedtime as needed.       Patient Stressors: Marital or family conflict    Patient Strengths: Capable of independent living  Physical Health   Treatment Modalities: Medication Management, Group therapy, Case management,  1 to 1 session with clinician, Psychoeducation, Recreational therapy.   Physician Treatment Plan for Primary Diagnosis: Psychosis, unspecified psychosis type (HCC) Long Term Goal(s): Improvement in symptoms so as ready for discharge   Short Term Goals: Ability to identify changes in lifestyle to reduce recurrence of condition will improve Ability to  verbalize feelings will improve Ability to disclose and discuss suicidal ideas Ability to demonstrate self-control will improve Ability to identify and develop effective coping behaviors will improve Ability to maintain clinical measurements within normal limits will improve Compliance with prescribed medications will improve Ability to identify triggers associated with substance abuse/mental health issues will improve  Medication Management: Evaluate patient's response, side effects, and tolerance of medication regimen.  Therapeutic Interventions: 1 to 1 sessions, Unit Group sessions and Medication administration.  Evaluation of Outcomes: Not Progressing  Physician Treatment Plan for Secondary Diagnosis: Principal Problem:   Psychosis, unspecified psychosis type (HCC)  Long Term Goal(s): Improvement in symptoms so as ready for discharge   Short Term Goals: Ability to identify changes in lifestyle to reduce recurrence of condition will improve Ability to verbalize feelings will improve Ability to disclose and discuss suicidal ideas Ability to demonstrate self-control will improve Ability to identify and develop effective coping behaviors will improve Ability to maintain clinical measurements within normal limits will improve Compliance with prescribed medications will improve Ability to identify triggers associated with substance abuse/mental health issues will improve     Medication Management: Evaluate patient's response, side effects, and tolerance of medication regimen.  Therapeutic Interventions: 1 to 1 sessions, Unit Group sessions and Medication administration.  Evaluation of Outcomes: Not Progressing   RN Treatment Plan for Primary Diagnosis: Psychosis, unspecified psychosis type (HCC) Long Term Goal(s): Knowledge of disease and therapeutic regimen to maintain health will improve  Short Term Goals: Ability to remain free from injury will improve, Ability to verbalize  frustration and anger appropriately will improve, Ability to demonstrate self-control, Ability to participate in decision making will improve, Ability to verbalize feelings will improve, and Compliance with prescribed medications will improve  Medication Management: RN will administer medications as ordered by provider, will assess and evaluate patient's response and provide education to patient for prescribed medication. RN will report any adverse and/or side effects to prescribing provider.  Therapeutic Interventions: 1 on 1 counseling sessions, Psychoeducation, Medication administration, Evaluate responses to treatment, Monitor vital signs and CBGs as ordered, Perform/monitor CIWA, COWS, AIMS and Fall Risk screenings as ordered, Perform wound care treatments as ordered.  Evaluation of Outcomes: Not Progressing   LCSW Treatment Plan for Primary Diagnosis: Psychosis, unspecified psychosis type (HCC) Long Term Goal(s): Safe transition to appropriate next level of care at discharge, Engage patient in therapeutic group addressing interpersonal concerns.  Short Term Goals: Engage patient in aftercare planning with referrals and resources, Increase social support, Increase ability to appropriately verbalize feelings, Increase  emotional regulation, Facilitate acceptance of mental health diagnosis and concerns, and Increase skills for wellness and recovery  Therapeutic Interventions: Assess for all discharge needs, 1 to 1 time with Social worker, Explore available resources and support systems, Assess for adequacy in community support network, Educate family and significant other(s) on suicide prevention, Complete Psychosocial Assessment, Interpersonal group therapy.  Evaluation of Outcomes: Not Progressing   Progress in Treatment: Attending groups: Yes. Participating in groups: Yes. and No. Taking medication as prescribed: Yes. Toleration medication: Yes. Family/Significant other contact made:  Yes, individual(s) contacted:  Once patient provides permission. Patient understands diagnosis: No. Discussing patient identified problems/goals with staff: Yes. Medical problems stabilized or resolved: Yes. Denies suicidal/homicidal ideation: Yes. Issues/concerns per patient self-inventory: No. Other: None  New problem(s) identified: No, Describe:  None identified Update 10/24/23: No changes at this time.   New Short Term/Long Term Goal(s): elimination of symptoms of psychosis, medication management for mood stabilization; elimination of SI thoughts; development of comprehensive mental wellness plan. Update  10/24/23: No changes at this time.   Patient Goals:  "To realize that there are millions of people that are in this" Update 10/24/23: No changes at this time.   Discharge Plan or Barriers: CSW will assist with appropriate discharge planning Update 10/24/23: No changes at this time.   Reason for Continuation of Hospitalization: Delusions  Mania Medication stabilization   Estimated Length of Stay: 1 to 7 days  Update 10/24/23: No changes at this time.  Last 3 [place] Suicide Severity Risk Score: Flowsheet Row Admission (Current) from 09/18/2023 in [facility] ED from 09/15/2023 in [facility] Department at [facility] ED from 07/02/2023 in [facility] Department at [facility]  C-SSRS RISK CATEGORY No Risk No Risk No Risk       Last PHQ 2/9 Scores:    01/24/2019    9:27 AM 10/27/2018    2:47 PM 08/28/2018    2:22 PM  Depression screen PHQ 2/9  Decreased Interest 0 0 0  Down, Depressed, Hopeless 1 1 0  PHQ - 2 Score 1 1 0  Altered sleeping 0 1 0  Tired, decreased energy 0 0 1  Change in appetite 0 0 0  Feeling bad or failure about yourself  1 0 0  Trouble concentrating 0 0 0  Moving slowly or fidgety/restless 0 0 0  Suicidal thoughts 0 0 0  PHQ-9 Score 2 2 1   Difficult doing work/chores Somewhat difficult Not difficult  at all Not difficult at all    Scribe for Treatment Team: Marshell Levan, LCSW 09/24/2023 11:01 AM
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
## 2023-09-24 NOTE — Plan of Care (Signed)
D: Pt alert and oriented. Pt reports experiencing anxiety/depression at this time. Pt reports experiencing 7/10 bilat foot pain at this time, prn medication offered and refused by pt. Pt states she will just take scheduled medications and take a warm shower. Pt states she will notify this writer if she needs prn medication at later time. Pt denies experiencing any SI/HI, or AVH at this time.   A: Scheduled medications administered to pt, per MD orders. Support and encouragement provided. Frequent verbal contact made. Routine safety checks conducted q15 minutes.   R: No adverse drug reactions noted. Pt verbally contracts for safety at this time. Pt compliant with medications and treatment plan. Pt interacts well with others on the unit. Pt remains safe at this time. Plan of care ongoing.  Pt stated this morning during this writer's assessment that she can see the future. MD was notified and informed.    Problem: Coping: Goal: Will verbalize feelings Outcome: Progressing   Problem: Coping: Goal: Level of anxiety will decrease Outcome: Not Progressing

## 2023-09-25 DIAGNOSIS — F29 Unspecified psychosis not due to a substance or known physiological condition: Secondary | ICD-10-CM | POA: Diagnosis not present

## 2023-09-25 MED ORDER — OXYCODONE HCL 5 MG PO TABS
5.0000 mg | ORAL_TABLET | Freq: Two times a day (BID) | ORAL | Status: DC | PRN
  Administered 2023-09-26 – 2023-09-27 (×3): 5 mg via ORAL
  Filled 2023-09-25 (×3): qty 1

## 2023-09-25 NOTE — Group Note (Signed)
Date:  09/25/2023 Time:  3:10 PM  Group Topic/Focus:  Healthy Communication:   The focus of this group is to discuss communication, barriers to communication, as well as healthy ways to communicate with others. Movie therapy encourages emotional release Even those who often have trouble expressing their emotions might find themselves laughing or crying during a film. This release of emotions can have a cathartic effect and also make it easier for a person to become more comfortable in expressing their emotions.    Participation Level:  Active  Participation Quality:  Appropriate  Affect:  Appropriate  Cognitive:  Appropriate  Insight: Appropriate  Engagement in Group:  Developing/Improving  Modes of Intervention:  Activity  Additional Comments:    Crescent Gotham 09/25/2023, 3:10 PM
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
## 2023-09-25 NOTE — Plan of Care (Signed)
  Problem: Education: Goal: Knowledge of General Education information will improve Description: Including pain rating scale, medication(s)/side effects and non-pharmacologic comfort measures Outcome: Progressing   Problem: Health Behavior/Discharge Planning: Goal: Ability to manage health-related needs will improve Outcome: Progressing   Problem: Coping: Goal: Level of anxiety will decrease Outcome: Progressing   Problem: Safety: Goal: Ability to remain free from injury will improve Outcome: Progressing   Problem: Skin Integrity: Goal: Risk for impaired skin integrity will decrease Outcome: Progressing   Problem: Education: Goal: Will be free of psychotic symptoms Outcome: Progressing Goal: Knowledge of the prescribed therapeutic regimen will improve Outcome: Progressing   Problem: Coping: Goal: Coping ability will improve Outcome: Progressing Goal: Will verbalize feelings Outcome: Progressing   Problem: Self-Concept: Goal: Will verbalize positive feelings about self Outcome: Progressing

## 2023-09-25 NOTE — Group Note (Signed)
Date:  09/25/2023 Time:  10:31 AM  Group Topic/Focus:  Goals Group:   The focus of this group is to help patients establish daily goals to achieve during treatment and discuss how the patient can incorporate goal setting into their daily lives to aide in recovery. Managing Feelings:   The focus of this group is to identify what feelings patients have difficulty handling and develop a plan to handle them in a healthier way upon discharge. Self Care:   The focus of this group is to help patients understand the importance of self-care in order to improve or restore emotional, physical, spiritual, interpersonal, and financial health.    Participation Level:  Active  Participation Quality:  Appropriate  Affect:  Appropriate  Cognitive:  Alert and Appropriate  Insight: Appropriate  Engagement in Group:  Developing/Improving  Modes of Intervention:  Activity, Discussion, and Education  Additional Comments:    Rosaura Carpenter 09/25/2023, 10:31 AM

## 2023-09-25 NOTE — Plan of Care (Signed)
D: Pt alert and oriented. Pt reports experiencing anxiety/depression at this time. Pt reports experiencing 8/10 generalized chronic pain at this time, prn medications given. Pt denies experiencing any SI/HI, or AVH at this time.   A: Scheduled medications administered to pt, per MD orders. Support and encouragement provided. Frequent verbal contact made. Routine safety checks conducted q15 minutes.   R: No adverse drug reactions noted. Pt verbally contracts for safety at this time. Pt compliant with medications and treatment plan. Pt interacts well with others on the unit. Pt remains safe at this time. Plan of care ongoing.  When asking pt if she hears or sees things that may not be really pt stated she does not hear voices that she does have conceptions of thoughts and downloads of information. Pt states this is how she has predictions.   Pt later this afternoon has c/o being tired. Pt observed standing with eyes closed, mouth open, and snoring. It was suggested that pt take a brief nap and that this writer would come and wake her after and hour if she would like, Pt refused. Pt was educated on her medications to include PRN medication, specifically oxycodone. Pt refuses to acknowledge the affects of oxycodone. Pt states it does not do that to her, she has been on it for some time and needs it to be able to walk. Pt reports of multiple injuries of which she needs the medications for. Pt becomes irritable and argumentative when discussing the effects of oxycodone with pt.   Problem: Education: Goal: Knowledge of the prescribed therapeutic regimen will improve Outcome: Progressing   Problem: Coping: Goal: Will verbalize feelings Outcome: Progressing

## 2023-09-26 DIAGNOSIS — F29 Unspecified psychosis not due to a substance or known physiological condition: Secondary | ICD-10-CM | POA: Diagnosis not present

## 2023-09-26 LAB — LIPID PANEL
Cholesterol: 225 mg/dL — ABNORMAL HIGH (ref 0–200)
HDL: 43 mg/dL (ref >40)
LDL Cholesterol: UNDETERMINED mg/dL (ref 0–99)
Total CHOL/HDL Ratio: 5.2 {ratio}
Triglycerides: 533 mg/dL — ABNORMAL HIGH (ref <150)
VLDL: UNDETERMINED mg/dL (ref 0–40)

## 2023-09-26 LAB — HEMOGLOBIN A1C
Hgb A1c MFr Bld: 5.5 % (ref 4.8–5.6)
Mean Plasma Glucose: 111.15 mg/dL

## 2023-09-26 MED ORDER — IBUPROFEN 200 MG PO TABS
600.0000 mg | ORAL_TABLET | Freq: Four times a day (QID) | ORAL | Status: DC | PRN
  Administered 2023-09-26 – 2023-09-28 (×3): 600 mg via ORAL
  Filled 2023-09-26 (×3): qty 3
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
## 2023-09-26 NOTE — [facility] Counselor (Signed)
CSW contacted [facility] Non-Emergency number to do a wellness check on pt's ex-husband as CSW has attempted to call twice today and has been unable to reach and unable to LVM.   Non-Emergency operator reports that they will make contact with pt's ex-husband and will give him CSW's number.    Reynaldo Minium, MSW, [place] 09/26/2023 12:51 PM

## 2023-09-26 NOTE — [facility] Suicide Risk Assessment (Signed)
[facility] INPATIENT:  Family/Significant Other Suicide Prevention Education  Suicide Prevention Education:  Education Completed; Lori [name], ex-husband/[phone]  has been identified by the patient as the family member/significant other with whom the patient will be residing, and identified as the person(s) who will aid the patient in the event of a mental health crisis (suicidal ideations/suicide attempt).  With written consent from the patient, the family member/significant other has been provided the following suicide prevention education, prior to the and/or following the discharge of the patient.  The suicide prevention education provided includes the following: Suicide risk factors Suicide prevention and interventions National Suicide Hotline telephone number [facility] assessment telephone number [facility] Emergency Assistance 911 [facility] and/or Residential Mobile Crisis Unit telephone number  Request made of family/significant other to: Remove weapons (e.g., guns, rifles, knives), all items previously/currently identified as safety concern.   Remove drugs/medications (over-the-counter, prescriptions, illicit drugs), all items previously/currently identified as a safety concern.  The family member/significant other verbalizes understanding of the suicide prevention education information provided.  The family member/significant other agrees to remove the items of safety concern listed above.  Lori [name] 09/26/2023, 4:02 PM

## 2023-09-26 NOTE — Progress Notes (Signed)
 Patient pleasant.  Anxious, worried affect.  Preoccupied with finding her belongings, specifically her purse and computer. Endorses anxiety and depression.  Denies SI/HI and AVH.  Reports she didn't sleep well. "I didn't know where I was."    Compliant with scheduled medications.  PRN medications given for pain and anxiety. 15 min checks in place for safety.  Patient paces the unit.  Observed in the dayroom sitting with eyes closed.   Minimal interaction with peers. Assertive interaction with staff.

## 2023-09-26 NOTE — Group Note (Signed)
Date:  09/26/2023 Time:  8:37 PM  Group Topic/Focus:  Dimensions of Wellness:   The focus of this group is to introduce the topic of wellness and discuss the role each dimension of wellness plays in total health. Self Care:   The focus of this group is to help patients understand the importance of self-care in order to improve or restore emotional, physical, spiritual, interpersonal, and financial health.    Participation Level:  Active  Participation Quality:  Appropriate  Affect:  Blunted  Cognitive:  Appropriate  Insight: Good  Engagement in Group:  Engaged  Modes of Intervention:  Discussion  Additional Comments:    Lori [name] 09/26/2023, 8:37 PM

## 2023-09-26 NOTE — [facility] Counselor (Addendum)
CSW received return call from Tasia Catchings, he reports some concerns about pt returning home and not taking her medicine and wants pt to be "forced to be medicated".   CSW informed Thayer Ohm that pt is not incompetent and has the rights to not take medication although hospital staff strongly encourage pt to follow-up with medications and outpatient services.   CSW discussed various outpatient services that pt, if agreeable, could use.   Thayer Ohm asked CSW to text him the day and time of pt's discharge so he could be home to let her in as she does not have her keys or purse.   CSW texted requested information to [place] at [phone]    Reynaldo Minium, MSW, [facility] 09/26/2023 3:22 PM

## 2023-09-26 NOTE — Plan of Care (Signed)
  Problem: Safety: Goal: Ability to remain free from injury will improve Outcome: Progressing   Problem: Skin Integrity: Goal: Risk for impaired skin integrity will decrease Outcome: Progressing   Problem: Coping: Goal: Level of anxiety will decrease Outcome: Not Progressing   

## 2023-09-26 NOTE — Group Note (Signed)
Recreation Therapy Group Note   Group Topic:Coping Skills  Group Date: 09/26/2023 Start Time: 1400 End Time: 1450 Facilitators: Rosina Lowenstein, LRT, CTRS Location:  Dayroom  Group Description: Mind Map.  Patient was provided a blank template of a diagram with 32 blank boxes in a tiered system, branching from the center (similar to a bubble chart). LRT directed patients to label the middle of the diagram "Coping Skills". LRT and patients then came up with 8 different coping skills as examples. Pt were directed to record their coping skills in the 2nd tier boxes closest to the center.  Patients would then share their coping skills with the group as LRT wrote them out. LRT gave a handout of 99 different coping skills at the end of group.   Goal Area(s) Addressed: Patients will be able to define "coping skills". Patient will identify new coping skills.  Patient will increase communication.   Affect/Mood: Tired and Drowsy   Participation Level: Minimal   Participation Quality: Minimal Cues   Behavior: Sedated   Speech/Thought Process: Loose association   Insight: Limited   Judgement: Limited   Modes of Intervention: Education, Guided Discussion, and Worksheet   Patient Response to Interventions:  Disengaged   Education Outcome:  In group clarification offered    Clinical Observations/Individualized Feedback: Lori [name] was somewhat active in their participation of session activities and group discussion. Pt identified "I like to be in a cave. I want everyone to be happy and enjoy nature and enjoy beautiful days. People sound like birds sometimes." Pt fell asleep multiple times during  group and was falling over sitting up which then woke herself up. Pt also easily aroused to her name being called. Pt was pleasant when alert enough to engage in conversation.    Plan: Continue to engage patient in RT group sessions 2-3x/week.   Rosina Lowenstein, LRT, CTRS 09/26/2023 3:00 PM

## 2023-09-26 NOTE — Group Note (Signed)
Date:  09/26/2023 Time:  6:43 AM  Group Topic/Focus:  Personal Choices and Values:   The focus of this group is to help patients assess and explore the importance of values in their lives, how their values affect their decisions, how they express their values and what opposes their expression.    Participation Level:  Minimal  Participation Quality:  Inattentive  Affect:  Anxious  Cognitive:  Oriented  Insight: None  Engagement in Group:  None  Modes of Intervention:  Discussion  Additional Comments:    Maeola Harman 09/26/2023, 6:43 AM

## 2023-09-26 NOTE — Plan of Care (Signed)
  Problem: Education: Goal: Knowledge of General Education information will improve Description: Including pain rating scale, medication(s)/side effects and non-pharmacologic comfort measures Outcome: Not Progressing   Problem: Health Behavior/Discharge Planning: Goal: Ability to manage health-related needs will improve Outcome: Not Progressing   Problem: Coping: Goal: Level of anxiety will decrease Outcome: Not Progressing   Problem: Safety: Goal: Ability to remain free from injury will improve 09/26/2023 0543 by Elbert Ewings, RN Outcome: Not Progressing 09/26/2023 0543 by Elbert Ewings, RN Outcome: Progressing   Problem: Skin Integrity: Goal: Risk for impaired skin integrity will decrease 09/26/2023 0543 by Elbert Ewings, RN Outcome: Not Progressing 09/26/2023 0543 by Elbert Ewings, RN Outcome: Progressing   Problem: Education: Goal: Will be free of psychotic symptoms 09/26/2023 0543 by Elbert Ewings, RN Outcome: Not Progressing 09/26/2023 0543 by Elbert Ewings, RN Outcome: Progressing Goal: Knowledge of the prescribed therapeutic regimen will improve 09/26/2023 0543 by Elbert Ewings, RN Outcome: Not Progressing 09/26/2023 0543 by Elbert Ewings, RN Outcome: Progressing   Problem: Coping: Goal: Coping ability will improve 09/26/2023 0543 by Elbert Ewings, RN Outcome: Not Progressing 09/26/2023 0543 by Elbert Ewings, RN Outcome: Progressing Goal: Will verbalize feelings 09/26/2023 0543 by Elbert Ewings, RN Outcome: Not Progressing 09/26/2023 0543 by Elbert Ewings, RN Outcome: Progressing   Problem: Self-Concept: Goal: Will verbalize positive feelings about self 09/26/2023 0543 by Elbert Ewings, RN Outcome: Not Progressing 09/26/2023 0543 by Elbert Ewings, RN Outcome: Progressing

## 2023-09-27 DIAGNOSIS — F29 Unspecified psychosis not due to a substance or known physiological condition: Secondary | ICD-10-CM | POA: Diagnosis not present

## 2023-09-27 MED ORDER — OXYCODONE HCL 5 MG PO TABS
5.0000 mg | ORAL_TABLET | Freq: Four times a day (QID) | ORAL | Status: DC | PRN
  Administered 2023-09-27 – 2023-09-28 (×4): 5 mg via ORAL
  Filled 2023-09-27 (×4): qty 1

## 2023-09-27 NOTE — Plan of Care (Signed)
?  Problem: Coping: ?Goal: Level of anxiety will decrease ?Outcome: Progressing ?  ?Problem: Safety: ?Goal: Ability to remain free from injury will improve ?Outcome: Progressing ?  ?

## 2023-09-27 NOTE — Progress Notes (Signed)
Patient constantly at the nurses station with various complaints. She states her throat is sore and she is unable to swallow but proceeds to eat a pack of crackers in the dayroom.

## 2023-09-27 NOTE — Group Note (Signed)
Recreation Therapy Group Note   Group Topic:Self-Esteem  Group Date: 09/27/2023 Start Time: 1405 End Time: 1455 Facilitators: Rosina Lowenstein, LRT, CTRS Location:  Dayroom  Group Description: My strengths and Qualities. Patients and LRT discussed the importance of self-love/self-esteem and things that cause it to fluctuate, including our mental health or state. Pt completed a worksheet that helps them identify 24 different strengths and qualities about themselves. Pt encouraged to read aloud at least 3 off their sheet to the group. LRT and pts discussed how this can be applied to daily life post-discharge.  Pt's then played "Positive Affirmation Bingo" afterwards, with stress balls as prizes.     Goal Area(s) Addressed: Patient will identify positive qualities about themselves. Patient will learn new positive affirmations.  Patient will recite positive qualities and affirmations aloud to the group.  Patient will practice positive self-talk.  Patient will increase communication.   Affect/Mood: Sleeping   Participation Level: Minimal   Participation Quality: Minimal Cues   Behavior: Sedated   Speech/Thought Process: Loose association   Insight: Limited   Judgement: Limited   Modes of Intervention: Education, Guided Discussion, and Worksheet   Patient Response to Interventions:  Disengaged   Education Outcome:  In group clarification offered    Clinical Observations/Individualized Feedback: Lori [name] was not active in their participation of session activities and group discussion. Pt identified "I am having an off day. I am running a fever and my throat hurts since an hour ago". Pt was noted to be sitting in the chair away from the group the entire session. Pt sat with her eyes closed and head would periodically fall down with her chin being on her chest. Pt did not interact with peers and minimally interacted with LRT.    Plan: Continue to engage patient in RT group sessions  2-3x/week.   Rosina Lowenstein, LRT, CTRS 09/27/2023 4:34 PM

## 2023-09-27 NOTE — Group Note (Signed)
  Date:  09/27/2023 Time:  10:53 PM  Group Topic/Focus:  Goals Group:   The focus of this group is to help patients establish daily goals to achieve during treatment and discuss how the patient can incorporate goal setting into their daily lives to aide in recovery.    Participation Level:  Active  Participation Quality:  Appropriate  Affect:  Appropriate  Cognitive:  Alert  Insight: Improving  Engagement in Group:  Improving  Modes of Intervention:  Discussion  Additional Comments:    Maeola Harman 09/27/2023, 10:53 PM

## 2023-09-27 NOTE — Plan of Care (Signed)
  Problem: Health Behavior/Discharge Planning: Goal: Ability to manage health-related needs will improve Outcome: Progressing   Problem: Coping: Goal: Level of anxiety will decrease Outcome: Progressing  Patient compliant with medications appears to be improving this shift has been coming to the Nurse Station less this evening she is interacting well with Games developer. Q15 minutes safety checks ongoing. Patient wakes ups about every 2/3 hours she will ask for coffee requires redirections, but compliant. Support and encouragement is ongoing.

## 2023-09-27 NOTE — Progress Notes (Signed)
 Patient pleasant.  Worried, anxious affect.  Appears sleepy.  Observed sitting in dayroom with eyes closed. Endorses anxiety.  Denies SI/HI and AVH.  Pain rated 8/10 in legs/jaw.  Poor sleep reported by night shift nurse.     Compliant with scheduled medications.  PRN medication given for pain.  Nicorette gum given per request. 15 min checks in place for safety.  Patient pacing and restless.  Present in the milieu sporadically, often leaving in the middle of groups.  Minimal interaction with peers.  Able to express needs to staff.   PRN oxycodone changed to q 6 hours.

## 2023-09-27 NOTE — Group Note (Signed)
[facility] LCSW Group Therapy Note   Group Date: 09/27/2023 Start Time: 1300 End Time: 1400   Type of Therapy/Topic:  Group Therapy:  Emotion Regulation  Participation Level:  Minimal   Mood:  Description of Group:    The purpose of this group is to assist patients in learning to regulate negative emotions and experience positive emotions. Patients will be guided to discuss ways in which they have been vulnerable to their negative emotions. These vulnerabilities will be juxtaposed with experiences of positive emotions or situations, and patients challenged to use positive emotions to combat negative ones. Special emphasis will be placed on coping with negative emotions in conflict situations, and patients will process healthy conflict resolution skills.  Therapeutic Goals: Patient will identify two positive emotions or experiences to reflect on in order to balance out negative emotions:  Patient will label two or more emotions that they find the most difficult to experience:  Patient will be able to demonstrate positive conflict resolution skills through discussion or role plays:   Summary of Patient Progress:   Pt did not participate throughout group but did stay in the room     Therapeutic Modalities:   Cognitive Behavioral Therapy Feelings Identification Dialectical Behavioral Therapy   [name], LCSWA

## 2023-09-27 NOTE — Progress Notes (Signed)
 Pt came to nurse's station stating she had a fever and sore throat.  Temperature 97.9 orally.  PRN pain medication will be given as ordered.

## 2023-09-27 NOTE — Group Note (Signed)
Date:  09/27/2023 Time:  11:01 AM  Group Topic/Focus:  Dimensions of Wellness:   The focus of this group is to introduce the topic of wellness and discuss the role each dimension of wellness plays in total health.    Participation Level:  Minimal  Participation Quality:  Inattentive  Affect:  Not Congruent  Cognitive:  Confused  Insight: Limited  Engagement in Group:  None  Modes of Intervention:  Discussion  Ardelle Anton 09/27/2023, 11:01 AM
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
## 2023-09-27 NOTE — Progress Notes (Addendum)
Pt is alert but disoriented about situation . Her affect is bright and her mood is congruent . Pt denies anxiety and depression . Patient reports pain in the side of her face which she rates at a 7/10. Denies  SI/HI/AVH at this time.  Scheduled medications administered to patient per MD orders . Support and encouragement  provided . Routine safety checks conducted every 15 minutes without incident . Patient informed to notify staff with problems or concerns and verbalizes understanding.      No adverse drug noted . Pt compliant with medications and treatment plan . Pt receptive, calm, cooperative and interacts well with others on the unit. Pt contracts for safety and remains safe on the unit at this time.  Patient has been pacing at night and walking with her eyes closed.

## 2023-09-27 NOTE — Progress Notes (Signed)
Patient awake pacing hall way required redirection appear confused reoriented to time and place and encouraged to go back to bed. Support ongoing.

## 2023-09-28 DIAGNOSIS — F29 Unspecified psychosis not due to a substance or known physiological condition: Secondary | ICD-10-CM | POA: Diagnosis not present

## 2023-09-28 MED ORDER — QUETIAPINE FUMARATE 300 MG PO TABS: 600.0000 mg | ORAL_TABLET | Freq: Every day | ORAL | 3 refills | Status: DC

## 2023-09-28 MED ORDER — QUETIAPINE FUMARATE 100 MG PO TABS: 100.0000 mg | ORAL_TABLET | Freq: Every day | ORAL | 3 refills | Status: DC

## 2023-09-28 MED ORDER — OXCARBAZEPINE 300 MG PO TABS: 300.0000 mg | ORAL_TABLET | Freq: Two times a day (BID) | ORAL | 3 refills | Status: DC

## 2023-09-28 MED ORDER — GABAPENTIN 100 MG PO CAPS: 200.0000 mg | ORAL_CAPSULE | Freq: Two times a day (BID) | ORAL | 3 refills | Status: DC

## 2023-09-28 MED ORDER — LORAZEPAM 0.5 MG PO TABS: 0.5000 mg | ORAL_TABLET | ORAL | 0 refills | Status: DC | PRN
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
## 2023-09-28 NOTE — BHH Counselor (Signed)
CSW referred pt to [employer] and [employer].  Reynaldo Minium, MSW, [facility] 09/28/2023 9:54 AM

## 2023-09-28 NOTE — BHH Suicide Risk Assessment (Signed)
[facility] Discharge Suicide Risk Assessment   Principal Problem: Psychosis, unspecified psychosis type (HCC) Discharge Diagnoses: Principal Problem:   Psychosis, unspecified psychosis type (HCC)  Bipolar disorder Total Time spent with patient: 1 hour  Musculoskeletal: Strength & Muscle Tone: within normal limits Gait & Station: normal Patient leans: N/A  Psychiatric Specialty Exam  Presentation  General Appearance:  Bizarre; Disheveled  Eye Contact: Fair  Speech: Pressured  Speech Volume: Normal  Handedness: Right   Mood and Affect  Mood: Anxious; Dysphoric  Duration of Depression Symptoms: No data recorded Affect: Congruent; Blunt   Thought Process  Thought Processes: Disorganized; Irrevelant  Descriptions of Associations:Loose  Orientation:Partial (to self only)  Thought Content:Illogical; Delusions  History of Schizophrenia/Schizoaffective disorder:Yes  Duration of Psychotic Symptoms:Greater than six months  Hallucinations:No data recorded Ideas of Reference:Delusions  Suicidal Thoughts:No data recorded Homicidal Thoughts:No data recorded  Sensorium  Memory: Immediate Poor; Remote Poor; Recent Poor  Judgment: Poor  Insight: Poor   Executive Functions  Concentration: Poor  Attention Span: Poor  Recall: Poor  Fund of Knowledge: Poor  Language: Fair   Psychomotor Activity  Psychomotor Activity:No data recorded  Assets  Assets: Communication Skills; Financial Resources/Insurance; Social Support   Sleep  Sleep:No data recorded   Blood pressure (!) 93/53, pulse 87, temperature 97.7 F (36.5 C), resp. rate 16, height 5\' 7"  (1.702 m), weight 62.8 kg, SpO2 100%. Body mass index is 21.69 kg/m.  Mental Status Per Nursing Assessment::   On Admission:  NA  Demographic Factors:  Caucasian  Loss Factors: NA  Historical Factors: NA  Risk Reduction Factors:   Living with another person, especially a relative, Positive  social support, Positive therapeutic relationship, and Positive coping skills or problem solving skills  Continued Clinical Symptoms:  Bipolar Disorder:   Mixed State  Cognitive Features That Contribute To Risk:  None    Suicide Risk:  Minimal: No identifiable suicidal ideation.  Patients presenting with no risk factors but with morbid ruminations; may be classified as minimal risk based on the severity of the depressive symptoms    Plan Of Care/Follow-up recommendations: See social work note   Lori Ill, DO 09/28/2023, 9:47 AM

## 2023-09-28 NOTE — Progress Notes (Signed)
  [facility] Adult Case Management Discharge Plan :  Will you be returning to the same living situation after discharge:  Yes,  pt will return home  At discharge, do you have transportation home?: Yes,  CSW will assist with transportation  Do you have the ability to pay for your medications: Yes,   BLUE CROSS BLUE SHIELD / BCBS[employer] NON-PARTICIPATING OON  Release of information consent forms completed and in the chart;  Patient's signature needed at discharge.  Patient to Follow up at:  Follow-up Information     [facility] Follow up.   Why: Although you declined scheduled follow-up I am including the walk-in hours for [facility], [employer].    [address] [place], [place] [address] Same-Day Access Hours:  Monday, Wednesday and Friday, 8am - 3pm Crisis & Diversion Center: Walk-In Crisis Hours: 7 days/week, 8am - 12am -  [facility] [employer] -  Patent examiner Drop-Off (side door) Contact information: [address] [place] [address] [phone]                 Next level of care provider has access to [facility] Link:no  Safety Planning and Suicide Prevention discussed: Yes,  Tasia Catchings, ex-husband/[phone]     Has patient been referred to the Quitline?: Patient refused referral for treatment  Patient has been referred for addiction treatment: No known substance use disorder.  [address] LCSWA 09/28/2023, 10:05 AM

## 2023-09-28 NOTE — Plan of Care (Signed)
  Problem: Education: Goal: Knowledge of General Education information will improve Description: Including pain rating scale, medication(s)/side effects and non-pharmacologic comfort measures Outcome: Progressing   Problem: Health Behavior/Discharge Planning: Goal: Ability to manage health-related needs will improve Outcome: Progressing   Problem: Coping: Goal: Level of anxiety will decrease Outcome: Progressing   Problem: Safety: Goal: Ability to remain free from injury will improve Outcome: Progressing   Problem: Skin Integrity: Goal: Risk for impaired skin integrity will decrease Outcome: Progressing   Problem: Education: Goal: Will be free of psychotic symptoms Outcome: Progressing Goal: Knowledge of the prescribed therapeutic regimen will improve Outcome: Progressing   Problem: Coping: Goal: Coping ability will improve Outcome: Progressing Goal: Will verbalize feelings Outcome: Progressing   Problem: Self-Concept: Goal: Will verbalize positive feelings about self Outcome: Progressing

## 2023-09-28 NOTE — Progress Notes (Signed)
   09/28/23 1016  Psych Admission Type (Psych Patients Only)  Admission Status Involuntary  Psychosocial Assessment  Patient Complaints Anxiety  Eye Contact Brief  Facial Expression Worried;Anxious  Affect Anxious  Speech Logical/coherent;Soft  Interaction Assertive  Motor Activity Slow  Appearance/Hygiene In scrubs  Behavior Characteristics Anxious;Pacing;Restless  Mood Anxious  Thought Chartered certified accountant of ideas;Disorganized  Content Preoccupation;Religiosity  Delusions Religious  Perception Derealization  Hallucination None reported or observed  Judgment Impaired  Confusion Mild  Danger to Self  Current suicidal ideation? Denies  Agreement Not to Harm Self Yes  Description of Agreement verbal  Danger to Others  Danger to Others None reported or observed   Patient discharged at this time via taxi. All discharge instructions given and read to patient with acknowledgment. Denies SI/HI/AVH.

## 2023-09-29 LAB — LDL CHOLESTEROL, DIRECT: Direct LDL: 138 mg/dL — ABNORMAL HIGH (ref 0–99)

## 2024-03-16 ENCOUNTER — Emergency Department
Admission: EM | Admit: 2024-03-16 | Discharge: 2024-03-18 | Disposition: A | Discharge status: Other Acute Inpatient Hospital | Attending: Emergency Medicine | Admitting: Emergency Medicine

## 2024-03-16 ENCOUNTER — Other Ambulatory Visit: Payer: Self-pay

## 2024-03-16 DIAGNOSIS — R4182 Altered mental status, unspecified: Secondary | ICD-10-CM | POA: Diagnosis present

## 2024-03-16 DIAGNOSIS — F250 Schizoaffective disorder, bipolar type: Secondary | ICD-10-CM | POA: Insufficient documentation

## 2024-03-16 DIAGNOSIS — F22 Delusional disorders: Secondary | ICD-10-CM | POA: Diagnosis not present

## 2024-03-16 DIAGNOSIS — F319 Bipolar disorder, unspecified: Secondary | ICD-10-CM | POA: Insufficient documentation

## 2024-03-16 DIAGNOSIS — F1910 Other psychoactive substance abuse, uncomplicated: Secondary | ICD-10-CM | POA: Diagnosis not present

## 2024-03-16 DIAGNOSIS — F1990 Other psychoactive substance use, unspecified, uncomplicated: Secondary | ICD-10-CM | POA: Diagnosis not present

## 2024-03-16 LAB — COMPREHENSIVE METABOLIC PANEL WITH GFR
ALT: 45 U/L — ABNORMAL HIGH (ref 0–44)
AST: 30 U/L (ref 15–41)
Albumin: 4.8 g/dL (ref 3.5–5.0)
Alkaline Phosphatase: 72 U/L (ref 38–126)
Anion gap: 13 (ref 5–15)
BUN: 16 mg/dL (ref 8–23)
CO2: 18 mmol/L — ABNORMAL LOW (ref 22–32)
Calcium: 10.4 mg/dL — ABNORMAL HIGH (ref 8.9–10.3)
Chloride: 108 mmol/L (ref 98–111)
Creatinine, Ser: 0.73 mg/dL (ref 0.44–1.00)
GFR, Estimated: >60 mL/min (ref >60)
Glucose, Bld: 124 mg/dL — ABNORMAL HIGH (ref 70–99)
Potassium: 3 mmol/L — ABNORMAL LOW (ref 3.5–5.1)
Sodium: 139 mmol/L (ref 135–145)
Total Bilirubin: 0.8 mg/dL (ref 0.0–1.2)
Total Protein: 8.2 g/dL — ABNORMAL HIGH (ref 6.5–8.1)

## 2024-03-16 LAB — CBC
HCT: 42.3 % (ref 36.0–46.0)
Hemoglobin: 14.3 g/dL (ref 12.0–15.0)
MCH: 27.9 pg (ref 26.0–34.0)
MCHC: 33.8 g/dL (ref 30.0–36.0)
MCV: 82.6 fL (ref 80.0–100.0)
Platelets: 314 10*3/uL (ref 150–400)
RBC: 5.12 MIL/uL — ABNORMAL HIGH (ref 3.87–5.11)
RDW: 13.2 % (ref 11.5–15.5)
WBC: 14.4 10*3/uL — ABNORMAL HIGH (ref 4.0–10.5)
nRBC: 0 % (ref 0.0–0.2)

## 2024-03-16 LAB — ETHANOL: Alcohol, Ethyl (B): <15 mg/dL (ref <15)

## 2024-03-16 MED ORDER — ZIPRASIDONE MESYLATE 20 MG IM SOLR
INTRAMUSCULAR | Status: AC
  Administered 2024-03-16: 20 mg via INTRAMUSCULAR
  Filled 2024-03-16: qty 20

## 2024-03-16 MED ORDER — ZIPRASIDONE MESYLATE 20 MG IM SOLR
20.0000 mg | Freq: Once | INTRAMUSCULAR | Status: AC
Start: 2024-03-16 — End: 2024-03-16
  Filled 2024-03-16: qty 20
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
## 2024-03-16 NOTE — ED Provider Notes (Addendum)
 [facility] Provider Note    Event Date/Time   First MD Initiated Contact with Patient 03/16/24 1813     (approximate)  History   Chief Complaint: Mental Health Problem  HPI  Lori [name] is a 63 y.o. female with a past medical history of anxiety, depression, bipolar, schizophrenia, presents to the emergency department under IVC.  According to the IVC by law enforcement patient was walking up and down the streets.  States she was rambling not making any sense.  During my evaluation patient is quite disorganized.  She will point to something on the wall and say that it is a camera where the people are watching her.  Patient appears quite paranoid.  Rapid speech, flight of ideas.  Physical Exam   Triage Vital Signs: ED Triage Vitals  Encounter Vitals Group     BP 03/16/24 1707 106/77     Systolic BP Percentile --      Diastolic BP Percentile --      Pulse Rate 03/16/24 1707 (!) 117     Resp 03/16/24 1707 17     Temp 03/16/24 1707 98.1 F (36.7 C)     Temp src --      SpO2 03/16/24 1707 98 %     Weight 03/16/24 1708 138 lb 7.2 oz (62.8 kg)     Height 03/16/24 1708 5\' 7"  (1.702 m)     Head Circumference --      Peak Flow --      Pain Score 03/16/24 1707 10     Pain Loc --      Pain Education --      Exclude from Growth Chart --     Most recent vital signs: Vitals:   03/16/24 1707  BP: 106/77  Pulse: (!) 117  Resp: 17  Temp: 98.1 F (36.7 C)  SpO2: 98%    General: Awake patient with rapid speech, disorganized thought.  Paranoia. CV:  Good peripheral perfusion.  Regular rate and rhythm  Resp:  Normal effort.  Equal breath sounds bilaterally.  Abd:  No distention.  ED Results / Procedures / Treatments   MEDICATIONS ORDERED IN ED: Medications - No data to display   IMPRESSION / MDM / ASSESSMENT AND PLAN / ED COURSE  I reviewed the triage vital signs and the nursing notes.  Patient's presentation is most consistent with acute  presentation with potential threat to life or bodily function.  Patient presents to the emergency department for rapid speech disorganized under IVC.  Continues to be quite disorganized in the emergency department.  We will maintain the IVC and have psychiatry evaluate.  Patient appears very paranoid as well.  She is not answering questions.  Continues to ramble on largely incoherently at times.  Patient's workup shows a reassuring CBC with just a slight leukocytosis, negative alcohol, reassuring chemistry.  Awaiting psychiatry and TTS evaluation.  Patient is becoming acutely agitated.  Continues to try to get out of bed and walk towards the ambulance bay doors to leave.  Patient is very confused having increased difficulty redirecting her.  Will dose a small dose of medication to help with patient's sedation.  Will move her to a more secured area of the emergency department so she can be closely monitored in a locked environment.  FINAL CLINICAL IMPRESSION(S) / ED DIAGNOSES   Schizophrenia Bipolar Altered mental status  Note:  This document was prepared using Dragon voice recognition software and may include unintentional dictation errors.  Ruth Cove, MD 03/16/24 2102

## 2024-03-16 NOTE — Progress Notes (Signed)
 Attempted to see pt for consult, she had been administered medication and reported to be unable to participate in evaluation at this time
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
## 2024-03-16 NOTE — ED Notes (Addendum)
 Pt talking to herself, speech is rambling and repetitive at times, appears to have visual and probably auditory hallucinations- is reaching out and pinching fingers together. Pt adamantly declines drink or food when asked, gets upset when asked direct questions. Pt sitting in hallway recliner in paper scrubs.

## 2024-03-16 NOTE — ED Triage Notes (Addendum)
 Pt comes in with [facility] department after receiving a call about the pt standing outside the general store ranting. Pt went back home, and said she was going to stay at home. LE called out again after pt was seen walking down the middle of the road. According to LE Pt has a history of bipolar and schizophrenia and hasn't been taking her medications per family. Pt very disorganized on presentation.  Pt dressed out by this RN and EDT Janeeta  2 shoes 2 socks 1 hat 2 bras 1 dress 1 pair of underwear  2 bracelets 3 rings Pair of earrings

## 2024-03-17 DIAGNOSIS — F250 Schizoaffective disorder, bipolar type: Secondary | ICD-10-CM | POA: Diagnosis not present

## 2024-03-17 DIAGNOSIS — F1990 Other psychoactive substance use, unspecified, uncomplicated: Secondary | ICD-10-CM

## 2024-03-17 LAB — URINE DRUG SCREEN, QUALITATIVE (ARMC ONLY)
Amphetamines, Ur Screen: NOT DETECTED
Barbiturates, Ur Screen: NOT DETECTED
Benzodiazepine, Ur Scrn: NOT DETECTED
Cannabinoid 50 Ng, Ur ~~LOC~~: POSITIVE — AB
Cocaine Metabolite,Ur ~~LOC~~: NOT DETECTED
MDMA (Ecstasy)Ur Screen: NOT DETECTED
Methadone Scn, Ur: NOT DETECTED
Opiate, Ur Screen: NOT DETECTED
Phencyclidine (PCP) Ur S: NOT DETECTED
Tricyclic, Ur Screen: NOT DETECTED

## 2024-03-17 LAB — RESP PANEL BY RT-PCR (RSV, FLU A&B, COVID)  RVPGX2
Influenza A by PCR: NEGATIVE
Influenza B by PCR: NEGATIVE
Resp Syncytial Virus by PCR: NEGATIVE
SARS Coronavirus 2 by RT PCR: NEGATIVE

## 2024-03-17 MED ORDER — POTASSIUM CHLORIDE CRYS ER 20 MEQ PO TBCR
40.0000 meq | EXTENDED_RELEASE_TABLET | Freq: Once | ORAL | Status: AC
  Administered 2024-03-17: 40 meq via ORAL
  Filled 2024-03-17: qty 2

## 2024-03-17 MED ORDER — OLANZAPINE 10 MG PO TBDP
10.0000 mg | ORAL_TABLET | Freq: Every day | ORAL | Status: DC | PRN
  Administered 2024-03-17: 10 mg via ORAL
  Filled 2024-03-17: qty 1

## 2024-03-17 MED ORDER — CEPHALEXIN 500 MG PO CAPS
500.0000 mg | ORAL_CAPSULE | Freq: Three times a day (TID) | ORAL | Status: DC
  Administered 2024-03-17 (×2): 500 mg via ORAL
  Filled 2024-03-17 (×4): qty 1

## 2024-03-17 MED ORDER — ZIPRASIDONE MESYLATE 20 MG IM SOLR
20.0000 mg | Freq: Once | INTRAMUSCULAR | Status: AC | PRN
  Administered 2024-03-17: 20 mg via INTRAMUSCULAR
  Filled 2024-03-17: qty 20

## 2024-03-17 NOTE — ED Notes (Signed)
 Pt out to day room stating her room is like a microwave and she can't stay in there or she won't be able to see or hear at all. This RN turned TV on for pt at her request but pt still would not stay in her room. PT sitting in day room with blanket.

## 2024-03-17 NOTE — ED Notes (Addendum)
 Spoke with Eveleen Hinds from [facility] who stated they would accept patient tomorrow after 1100. States they cannot accept patient until 24 hours after Geodon  IM was administered.

## 2024-03-17 NOTE — ED Notes (Signed)
 Pt up to window and back to room and talking nonstop and raising voice at RN and security guard. Addressed pt directly with security guard at side and informed pt if she does not stop talking nonstop with raised voice and waving arms at window, she will be given an IM shot to calm her down. Pt walked back to her room but continues to pace back and forth, accusing hospital staff of being incompetent and insisting she has an infection.

## 2024-03-17 NOTE — ED Notes (Signed)
 Patient currently sleeping, lunch tray held at nurses station.

## 2024-03-17 NOTE — ED Notes (Signed)
 Pt up to window again, waving arms around and talking about infection.

## 2024-03-17 NOTE — BH Assessment (Signed)
 Comprehensive Clinical Assessment (CCA) Note  03/17/2024 Lori [name] [id number]  Chief Complaint:  Chief Complaint  Patient presents with   Mental Health Problem   Lori [name] arrived to the ED by way of law enforcement.  She reports that she see aura and knew that there was going to be 2 plane crashes.  She states that the kids are going to get killed.  She reports that she was walking along the side of the road and the police "grabbed her up" when she tried to tell the police that the Halloween fixture will fall down and kill children.  She reported that she has had "nervous attacks" due to these circumstances.  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 She reports that she can "feel music down her back".  She is having persecutory thoughts that she is being mishandled by the police and hospital staff, which have caused her fingers to shrink to the size of an infants and that this will cause her to lose her musical ability and a loss of income.  She has threatened to sue the hospital. Lori [name] reports that she resides in [facility] which has a magnetic force that she can see.  She further identified that she comes from a family that is physically different, "half of us  don't have feet, half of us  don't have any fingers"  She spoke about physical difference she has due to the type of her familial lineage. She reports that she does not have depression.  She reports that the highway is dangerous to children.  She reports that she was having a panic attack.  She reports that she will have to have her fingers amputated. She reports feelings of anger due to the situation.  She denied the use of alcohol or drugs, though her UDS reported positive for cannabinoid.  She reports a prior diagnosis of bipolar disorder. She was tangential .  She identified that she believes that she is being recorded and she is talking to the cameras. "I am not talking to myself, I am talking to the cameras that  records everything.  Lori [name] denied a hisotry of substance use.  Lori [name] denied the suicidal ideation or intent.  She further denied homicidal ideation or intent.   TTS contacted Lori [name]  at [phone] for collateral information - No answer and the mailbox was full.    TTS contacted Lori [name] at [phone]  for additional collateral information, no one answered the call.   Visit Diagnosis: Schizoaffective disorder, bipolar type with psychotic features     CCA Screening, Triage and Referral (STR)  Patient Reported Information How did you hear about us ? Legal System  Referral name: No data recorded Referral phone number: No data recorded  Whom do you see for routine medical problems? No data recorded Practice/Facility Name: No data recorded Practice/Facility Phone Number: No data recorded Name of Contact: No data recorded Contact Number: No data recorded Contact Fax Number: No data recorded Prescriber Name: No data recorded Prescriber Address (if known): No data recorded  What Is the Reason for Your Visit/Call Today? Erratic behavior  How Long Has This Been Causing You Problems? > than 6 months  What Do You Feel Would Help You the Most Today? Treatment for Depression or other mood problem   Have You Recently Been in Any Inpatient Treatment (Hospital/Detox/Crisis Center/28-Day Program)? No data recorded Name/Location of Program/Hospital:No data recorded How Long Were You There? No data recorded When Were  You Discharged? No data recorded  Have You Ever Received Services From [facility] Before? No data recorded Who Do You See at [facility]? No data recorded  Have You Recently Had Any Thoughts About Hurting Yourself? No  Are You Planning to Commit Suicide/Harm Yourself At This time? No   Have you Recently Had Thoughts About Hurting Someone Lori [name]? No  Explanation: n/a   Have You Used Any Alcohol or Drugs in the Past 24 Hours? No  How Long Ago Did You  Use Drugs or Alcohol? No data recorded What Did You Use and How Much? Marijuana "a lot"   Do You Currently Have a Therapist/Psychiatrist? No  Name of Therapist/Psychiatrist: n/a   Have You Been Recently Discharged From Any Office Practice or Programs? No  Explanation of Discharge From Practice/Program: n/a     CCA Screening Triage Referral Assessment Type of Contact: Face-to-Face  Is this Initial or Reassessment? No data recorded Date Telepsych consult ordered in [facility]:  No data recorded Time Telepsych consult ordered in [facility]:  No data recorded  Patient Reported Information Reviewed? No data recorded Patient Left Without Being Seen? No data recorded Reason for Not Completing Assessment: No data recorded  Collateral Involvement: No collateral involved   Does Patient Have a Court Appointed Legal Guardian? No data recorded Name and Contact of Legal Guardian: No data recorded If Minor and Not Living with Parent(s), Who has Custody? n/a  Is CPS involved or ever been involved? Never  Is APS involved or ever been involved? Never   Patient Determined To Be At Risk for Harm To Self or Others Based on Review of Patient Reported Information or Presenting Complaint? No  Method: No Plan  Availability of Means: No access or NA  Intent: Vague intent or NA  Notification Required: No need or identified person  Additional Information for Danger to Others Potential: Active psychosis  Additional Comments for Danger to Others Potential: n/a  Are There Guns or Other Weapons in Your Home? No  Types of Guns/Weapons: No guns or weapons in the home  Are These Weapons Safely Secured?                            -- (n/a)  Who Could Verify You Are Able To Have These Secured: n/a  Do You Have any Outstanding Charges, Pending Court Dates, Parole/Probation? No  Contacted To Inform of Risk of Harm To Self or Others: Law Enforcement   Location of Assessment: [facility] ED   Does Patient  Present under Involuntary Commitment? Yes  IVC Papers Initial File Date: No data recorded  [place] of Residence: [place]   Patient Currently Receiving the Following Services: Not Receiving Services   Determination of Need: Emergent (2 hours)   Options For Referral: Inpatient Hospitalization     CCA Biopsychosocial Intake/Chief Complaint:  No data recorded Current Symptoms/Problems: No data recorded  Patient Reported Schizophrenia/Schizoaffective Diagnosis in Past: Yes   Strengths: Patient is able to communicate and complete her ADLs  Preferences: No data recorded Abilities: No data recorded  Type of Services Patient Feels are Needed: No data recorded  Initial Clinical Notes/Concerns: No data recorded  Mental Health Symptoms Depression:  None   Duration of Depressive symptoms: No data recorded  Mania:  Change in energy/activity; Increased Energy; Irritability; Racing thoughts; Recklessness   Anxiety:   Difficulty concentrating; Restlessness; Worrying; Tension; Sleep   Psychosis:  Delusions; Grossly disorganized or catatonic behavior; Grossly disorganized  speech; Hallucinations   Duration of Psychotic symptoms: -- (Unknown)   Trauma:  -- (UTA)   Obsessions:  Cause anxiety; Poor insight; Recurrent & persistent thoughts/impulses/images   Compulsions:  Absent insight/delusional; Poor Insight; Repeated behaviors/mental acts; "Driven" to perform behaviors/acts   Inattention:  Disorganized   Hyperactivity/Impulsivity:  N/A   Oppositional/Defiant Behaviors:  N/A   Emotional Irregularity:  N/A   Other Mood/Personality Symptoms:  Anxious/Delusional    Mental Status Exam Appearance and self-care  Stature:  Average   Weight:  Average weight   Clothing:  Disheveled   Grooming:  Neglected   Cosmetic use:  None   Posture/gait:  Tense   Motor activity:  Restless   Sensorium  Attention:  Distractible; Inattentive; Unaware   Concentration:  Focuses on  irrelevancies; Preoccupied; Scattered   Orientation:  Place; Person   Recall/memory:  Defective in Recent; Defective in Remote   Affect and Mood  Affect:  Anxious; Labile   Mood:  Anxious; Hypomania   Relating  Eye contact:  Avoided   Facial expression:  Anxious   Attitude toward examiner:  Cooperative   Thought and Language  Speech flow: Flight of Ideas   Thought content:  Delusions; Persecutions   Preoccupation:  Ruminations   Hallucinations:  -- (Patient denies but can be seen pacing in her room and talking to herself)   Organization:  No data recorded  [employer] of Knowledge:  Fair   Intelligence:  Average   Abstraction:  Functional   Judgement:  Poor   Reality Testing:  Distorted; Unaware   Insight:  Poor; None/zero insight   Decision Making:  Impulsive   Social Functioning  Social Maturity:  Impulsive   Social Judgement:  Heedless   Stress  Stressors:  Illness   Coping Ability:  Contractor Deficits:  None   Supports:  Support needed     Religion: Religion/Spirituality Are You A Religious Person?: Yes (UTA) How Might This Affect Treatment?: not assessed (n/a)  Leisure/Recreation: Leisure / Recreation Do You Have Hobbies?: No (Unable to assess)  Exercise/Diet: Exercise/Diet Do You Exercise?: No (UTA) Have You Gained or Lost A Significant Amount of Weight in the Past Six Months?: No (UTA) Do You Follow a Special Diet?: No (UTA) Do You Have Any Trouble Sleeping?: Yes   CCA Employment/Education Employment/Work Situation: Employment / Work Situation Employment Situation: Unemployed (Unable to assess) Patient's Job has Been Impacted by Current Illness:  (Unable to assess) Has Patient ever Been in the [employer]?: No (Unable to assess)  Education: Education Is Patient Currently Attending School?: No Last Grade Completed: 12 (Unable to determine; patient refused to answer.) Did You Attend College?: Yes (Unable to  determine; patient refused to answer.) What Type of College Degree Do you Have?: Associates degree Did You Have An Individualized Education Program (IIEP): No (UTA) Did You Have Any Difficulty At School?: No (UTA) Patient's Education Has Been Impacted by Current Illness: No   CCA Family/Childhood History Family and Relationship History: Family history Marital status: Divorced Does patient have children?: Yes (Unable to assess) How many children?: 1  Childhood History:  Childhood History By whom was/is the patient raised?: Both parents (Unable to assess) Did patient suffer any verbal/emotional/physical/sexual abuse as a child?: No (Unable to assess) Did patient suffer from severe childhood neglect?: No Has patient ever been sexually abused/assaulted/raped as an adolescent or adult?: No (Unable to assess) Was the patient ever a victim of a crime or a disaster?: No Witnessed  domestic violence?: No (Unable to assess) Has patient been affected by domestic violence as an adult?: No (Unable to assess)  Child/Adolescent Assessment:     CCA Substance Use Alcohol/Drug Use: Alcohol / Drug Use Pain Medications: SEE MAR Prescriptions: SEE MAR Over the Counter: SEE MAR History of alcohol / drug use?:  (UTA) Longest period of sobriety (when/how long): Pt did not report sobreity Negative Consequences of Use:  (Pt refused to answer) Withdrawal Symptoms:  (Pt refused to answer)                         ASAM's:  Six Dimensions of Multidimensional Assessment  Dimension 1:  Acute Intoxication and/or Withdrawal Potential:   Dimension 1:  Description of individual's past and current experiences of substance use and withdrawal: Pt reports she smoke a lot of marijuana daily, unable to specify amount and why she smoke marijuana.  Dimension 2:  Biomedical Conditions and Complications:   Dimension 2:  Description of patient's biomedical conditions and  complications: Pt did not reports  biomedical conditions  Dimension 3:  Emotional, Behavioral, or Cognitive Conditions and Complications:  Dimension 3:  Description of emotional, behavioral, or cognitive conditions and complications: Pt reports bipolar disorder  Dimension 4:  Readiness to Change:  Dimension 4:  Description of Readiness to Change criteria: contemplation  Dimension 5:  Relapse, Continued use, or Continued Problem Potential:  Dimension 5:  Relapse, continued use, or continued problem potential critiera description: continue to use  Dimension 6:  Recovery/Living Environment:  Dimension 6:  Recovery/Iiving environment criteria description: Pt did not reoprt living arrangements  ASAM Severity Score: ASAM's Severity Rating Score: 11  ASAM Recommended Level of Treatment: ASAM Recommended Level of Treatment: Level I Outpatient Treatment   Substance use Disorder (SUD) Substance Use Disorder (SUD)  Checklist Symptoms of Substance Use: Continued use despite persistent or recurrent social, interpersonal problems, caused or exacerbated by use, Continued use despite having a persistent/recurrent physical/psychological problem caused/exacerbated by use, Recurrent use that results in a failure to fulfill major role obligations (work, school, home), Presence of craving or strong urge to use, Repeated use in physically hazardous situations (Patient has no psych providers in place at this time.)  Recommendations for Services/Supports/Treatments: Recommendations for Services/Supports/Treatments Recommendations For Services/Supports/Treatments: Medication Management, Inpatient Hospitalization  DSM5 Diagnoses: Patient Active Problem List   Diagnosis Date Noted   Psychosis, unspecified psychosis type (HCC) 09/18/2023   Mania (HCC) 09/16/2023   Major depressive disorder, recurrent episode, severe, with psychotic behavior (HCC) 03/25/2019   Anxiety 03/24/2019   Encounter for chronic pain management 04/08/2017   Essential hypertension  08/19/2016   Hormone replacement therapy (HRT) 12/15/2015   Primary osteoarthritis of right knee 12/15/2015   Migraine without aura and with status migrainosus, not intractable 11/28/2015   Non-intractable cyclical vomiting with nausea 11/28/2015   Current smoker 07/17/2015   Fatty liver 09/01/2012   Renal cyst, right 01/14/2012   OCD (obsessive compulsive disorder) 12/23/2011   DYSTROPHIC NAILS 05/25/2010   Vitamin D  deficiency 05/05/2010   Depression 12/11/2008   Tear film insufficiency 12/04/2007   POLYARTHRALGIA 12/04/2007   Hyperlipidemia 09/05/2007   GEN OSTEOARTHROSIS INVOLVING MULTIPLE SITES 09/05/2007   Fibromyalgia 09/05/2007    Patient is recommended for inpatient treatment.   Ferna How, Counselor

## 2024-03-17 NOTE — ED Notes (Signed)
 Pt stating "I'll take you to the highest court" and "you're also neglectful in your duty!"

## 2024-03-17 NOTE — ED Notes (Signed)
 Pt spoke with tele psych provider.

## 2024-03-17 NOTE — ED Notes (Signed)
 Pt pacing between dayroom and individual room. Pt spoke to this NT and security while obtaining vitals stating she needs medical treatment and will "soon die from infection".

## 2024-03-17 NOTE — ED Notes (Addendum)
 Patient has been accepted to [facility].  Patient assigned to room Leartis Proud Accepting physician is Dr. Reinhold Carbine.  Call report to [phone].  Representative was [employer].   ER Staff is aware of it:  [facility] ER Secretary  Dr. Alejo Amsler, ER MD  Edwina Gram Patient's Nurse  Patient is to arrive on 03/18/2024 after 11:00 a.m.

## 2024-03-17 NOTE — ED Notes (Signed)
 Per [facility] patient to be referred out of system.  Referral information for Psychiatric Hospitalization faxed to;    Service Provider Phone Fax  [facility] [phone]  [facility] [phone]  [facility] [phone]  [facility] [phone]  [facility] [phone]  [facility] [phone]  [facility] [phone]  [facility] [phone]  [facility] [phone]  [facility] [phone]  [facility] [phone]  [facility] [phone]  [facility] [phone]  [facility] [phone]  [facility] [phone]  [place] EFAX [phone]  [facility] [phone]  [facility] [phone]  [facility] [phone]  [facility] [phone]  [facility] [phone]  [facility] [place] [phone]
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
## 2024-03-17 NOTE — ED Notes (Signed)
 Area noted mid chest between breast that is red, non open.  Patient concerned might get an infection, patient reassured that she was receiving antibiotics.

## 2024-03-17 NOTE — ED Provider Notes (Addendum)
 Emergency Medicine Observation Re-evaluation Note  Lori [name] is a 63 y.o. female, seen on rounds today.  Pt initially presented to the ED for complaints of Mental Health Problem Currently, the patient is resting comfortably.  Physical Exam  BP 124/86   Pulse 98   Temp 97.9 F (36.6 C) (Oral)   Resp 18   Ht 5\' 7"  (1.702 m)   Wt 62.8 kg   SpO2 98%   BMI 21.68 kg/m  Physical Exam General: No acute distress  ED Course / MDM  EKG:   I have reviewed the labs performed to date as well as medications administered while in observation.  Recent changes in the last 24 hours include no acute events.  Plan  Current plan is for psychiatric assessment.    Kandee Orion, MD 03/17/24 0730  Addendum: Called back in the room as patient was complaining of redness to her skin.  On her chest there is a wound with small amount of erythema around it measuring less than 2 cm.  Will start on Keflex.   Kandee Orion, MD 03/17/24 1124  Addendum: Psychiatry recommending inpatient admission.  Pending placement.   Kandee Orion, MD 03/17/24 [phone]

## 2024-03-17 NOTE — ED Notes (Signed)
 Attempted to wake patient for afternoon snack, pt still sleeping.

## 2024-03-17 NOTE — ED Notes (Signed)
 Pt accepted to [facility].  Pt is to arrive on 5.25.25 after 11am.

## 2024-03-17 NOTE — ED Notes (Signed)
 TTS at bedside to talk to pt.

## 2024-03-17 NOTE — ED Notes (Signed)
 Pt walking around in dayroom stating "don't mind me, I'm just doing shaman stuff".

## 2024-03-17 NOTE — ED Notes (Signed)
 Pt at door complaining that she has infections all over her skin and demanding treatment.

## 2024-03-17 NOTE — ED Notes (Signed)
IVC/Pending Consult

## 2024-03-17 NOTE — ED Notes (Signed)
 Provided pt with dinner tray and drink.

## 2024-03-17 NOTE — ED Notes (Signed)
 Pt up to restroom. Was able to provide urine sample. Pt given water at her request.

## 2024-03-17 NOTE — ED Notes (Signed)
 ivc/moved to bhu.

## 2024-03-17 NOTE — ED Notes (Signed)
 Pt declined snack, received drink.

## 2024-03-17 NOTE — ED Notes (Signed)
 TTS on way to talk to pt.

## 2024-03-18 MED ORDER — IBUPROFEN 600 MG PO TABS
600.0000 mg | ORAL_TABLET | Freq: Once | ORAL | Status: AC
  Administered 2024-03-18: 600 mg via ORAL
  Filled 2024-03-18: qty 1

## 2024-03-18 NOTE — ED Notes (Signed)
 This tech obtained vital signs on pt.

## 2024-03-18 NOTE — ED Provider Notes (Signed)
 Emergency Medicine Observation Re-evaluation Note  Lori [name] is a 63 y.o. female, seen on rounds today.  Pt initially presented to the ED for complaints of Mental Health Problem Currently, the patient is resting.  Physical Exam  BP (!) 129/57 (BP Location: Left Arm)   Pulse 81   Temp (!) 97.5 F (36.4 C) (Oral)   Resp 17   Ht 5\' 7"  (1.702 m)   Wt 62.8 kg   SpO2 97%   BMI 21.68 kg/m   General: No distress   ED Course / MDM  EKG:   I have reviewed the labs performed to date as well as medications administered while in observation.  Recent changes in the last 24 hours include accepted to inpatient facility. Ongoing treatment with PO ABX for cellulitis.  Plan  Current plan is for placement.    Arline Bennett, MD 03/18/24 [phone]

## 2024-03-18 NOTE — ED Notes (Signed)
 Patient at nsg station window reading time and date on clock and saying that she is going blind and that this place is an oven over and over again.  Patient stating that she needs to be release right now.  Explained to patient that she could not be let go a this time, but she would be going to a different facility around 11 am.

## 2024-03-18 NOTE — ED Notes (Signed)
 Report given to Marshell Skelton at [facility].

## 2024-03-18 NOTE — ED Notes (Signed)
 Patient pacing in room

## 2024-03-18 NOTE — ED Notes (Signed)
 ivc/pt accepted to [facility] on 03/18/24 after 11am.

## 2024-03-18 NOTE — ED Notes (Signed)
 EMTALA reviewed by charge RN

## 2024-03-18 NOTE — ED Notes (Signed)
Pt given breakfast and drink °

## 2024-06-23 LAB — COLOGUARD: COLOGUARD: NEGATIVE

## 2024-07-03 ENCOUNTER — Emergency Department
Admission: EM | Admit: 2024-07-03 | Discharge: 2024-07-04 | Disposition: A | Discharge status: Other Acute Inpatient Hospital | Attending: Emergency Medicine | Admitting: Emergency Medicine

## 2024-07-03 ENCOUNTER — Other Ambulatory Visit: Payer: Self-pay

## 2024-07-03 DIAGNOSIS — F319 Bipolar disorder, unspecified: Secondary | ICD-10-CM | POA: Insufficient documentation

## 2024-07-03 DIAGNOSIS — I10 Essential (primary) hypertension: Secondary | ICD-10-CM | POA: Insufficient documentation

## 2024-07-03 DIAGNOSIS — F23 Brief psychotic disorder: Secondary | ICD-10-CM | POA: Insufficient documentation

## 2024-07-03 MED ORDER — ALUM & MAG HYDROXIDE-SIMETH 200-200-20 MG/5ML PO SUSP: 30.0000 mL | Freq: Four times a day (QID) | ORAL | Status: DC | PRN

## 2024-07-03 MED ORDER — ONDANSETRON HCL 4 MG PO TABS: 4.0000 mg | ORAL_TABLET | Freq: Three times a day (TID) | ORAL | Status: DC | PRN

## 2024-07-03 MED ORDER — OLANZAPINE 10 MG PO TBDP
10.0000 mg | ORAL_TABLET | Freq: Every day | ORAL | Status: DC
  Administered 2024-07-04: 10 mg via ORAL
  Filled 2024-07-03: qty 1

## 2024-07-03 NOTE — ED Provider Notes (Signed)
   [facility] Provider Note    Event Date/Time   First MD Initiated Contact with Patient 07/03/24 2008     (approximate)   History   Chief Complaint: Psychiatric Evaluation   HPI  Lori [name] is a 63 y.o. female with a history of hypertension, schizoaffective disorder who is brought to the ED under involuntary commitment due to being observed to have disorganized thought process, delusional during a telehealth evaluation.  Patient reports auditory hallucinations, her mind being controlled by aliens.     Physical Exam   Triage Vital Signs: ED Triage Vitals  Encounter Vitals Group     BP 07/03/24 1939 (!) 153/77     Girls Systolic BP Percentile --      Girls Diastolic BP Percentile --      Boys Systolic BP Percentile --      Boys Diastolic BP Percentile --      Pulse Rate 07/03/24 1939 91     Resp 07/03/24 1939 16     Temp 07/03/24 1939 98.1 F (36.7 C)     Temp Source 07/03/24 1939 Oral     SpO2 07/03/24 1939 100 %     Weight 07/03/24 1939 145 lb (65.8 kg)     Height 07/03/24 1939 5' 7 (1.702 m)     Head Circumference --      Peak Flow --      Pain Score 07/03/24 1950 7     Pain Loc --      Pain Education --      Exclude from Growth Chart --     Most recent vital signs: Vitals:   07/03/24 1939  BP: (!) 153/77  Pulse: 91  Resp: 16  Temp: 98.1 F (36.7 C)  SpO2: 100%    General: Awake, no distress. CV:  Good peripheral perfusion.  Resp:  Normal effort.  Abd:  No distention.  Other:  No wounds   ED Results / Procedures / Treatments   Labs (all labs ordered are listed, but only abnormal results are displayed) Labs Reviewed - No data to display   EKG    RADIOLOGY    PROCEDURES:  Procedures   MEDICATIONS ORDERED IN ED: Medications  ondansetron  (ZOFRAN ) tablet 4 mg (has no administration in time range)  alum & mag hydroxide-simeth (MAALOX/MYLANTA) 200-200-20 MG/5ML suspension 30 mL (has no administration in time  range)     IMPRESSION / MDM / ASSESSMENT AND PLAN / ED COURSE  I reviewed the triage vital signs and the nursing notes.  Patient's presentation is most consistent with acute presentation with potential threat to life or bodily function.  Patient presents with acute psychosis.  Will continue IVC pending psychiatry evaluation.  She is medically stable.  The patient has been placed in psychiatric observation due to the need to provide a safe environment for the patient while obtaining psychiatric consultation and evaluation, as well as ongoing medical and medication management to treat the patient's condition.  The patient has been placed under full IVC at this time.      FINAL CLINICAL IMPRESSION(S) / ED DIAGNOSES   Final diagnoses:  Acute psychosis (HCC)     Rx / DC Orders   ED Discharge Orders     None        Note:  This document was prepared using Dragon voice recognition software and may include unintentional dictation errors.   Viviann Pastor, MD 07/03/24 2146

## 2024-07-03 NOTE — ED Triage Notes (Signed)
 Pt to ED via [facility] under IVC, per papers pt was on a video chat with provider who noted pt to be disorganized and fixated on delusions that she is being targeted by aliens and the [employer]. She is having auditory hallucinations of aliens controlling her mind. Pt talking about ufos in triage. Pt denies SI/HI. Pt calm and cooperative

## 2024-07-03 NOTE — ED Notes (Addendum)
 Pt dressed out by this tech and EDT Tracey. Pt belongings include:  Pink shirt  Brown pants Black shoes  Black socks Tan bra   Pt calm and cooperative while dressing out.

## 2024-07-04 ENCOUNTER — Encounter: Payer: Self-pay | Admitting: Psychiatry

## 2024-07-04 ENCOUNTER — Inpatient Hospital Stay
Admission: AD | Admit: 2024-07-04 | Discharge: 2024-07-11 | DRG: 885 | Disposition: A | Discharge status: Home / Self Care | Source: Intra-hospital | Attending: Psychiatry | Admitting: Psychiatry

## 2024-07-04 DIAGNOSIS — F17210 Nicotine dependence, cigarettes, uncomplicated: Secondary | ICD-10-CM | POA: Diagnosis present

## 2024-07-04 DIAGNOSIS — D72829 Elevated white blood cell count, unspecified: Secondary | ICD-10-CM | POA: Diagnosis present

## 2024-07-04 DIAGNOSIS — G8929 Other chronic pain: Secondary | ICD-10-CM | POA: Diagnosis present

## 2024-07-04 DIAGNOSIS — Z823 Family history of stroke: Secondary | ICD-10-CM | POA: Diagnosis not present

## 2024-07-04 DIAGNOSIS — Z91148 Patient's other noncompliance with medication regimen for other reason: Secondary | ICD-10-CM

## 2024-07-04 DIAGNOSIS — F419 Anxiety disorder, unspecified: Secondary | ICD-10-CM | POA: Diagnosis present

## 2024-07-04 DIAGNOSIS — Z5941 Food insecurity: Secondary | ICD-10-CM

## 2024-07-04 DIAGNOSIS — Z83438 Family history of other disorder of lipoprotein metabolism and other lipidemia: Secondary | ICD-10-CM

## 2024-07-04 DIAGNOSIS — F22 Delusional disorders: Secondary | ICD-10-CM | POA: Diagnosis present

## 2024-07-04 DIAGNOSIS — Z8269 Family history of other diseases of the musculoskeletal system and connective tissue: Secondary | ICD-10-CM | POA: Diagnosis not present

## 2024-07-04 DIAGNOSIS — Z90710 Acquired absence of both cervix and uterus: Secondary | ICD-10-CM

## 2024-07-04 DIAGNOSIS — Z90722 Acquired absence of ovaries, bilateral: Secondary | ICD-10-CM

## 2024-07-04 DIAGNOSIS — F319 Bipolar disorder, unspecified: Principal | ICD-10-CM | POA: Diagnosis present

## 2024-07-04 DIAGNOSIS — Z79899 Other long term (current) drug therapy: Secondary | ICD-10-CM | POA: Diagnosis not present

## 2024-07-04 DIAGNOSIS — E785 Hyperlipidemia, unspecified: Secondary | ICD-10-CM | POA: Diagnosis present

## 2024-07-04 DIAGNOSIS — I10 Essential (primary) hypertension: Secondary | ICD-10-CM | POA: Diagnosis present

## 2024-07-04 DIAGNOSIS — F29 Unspecified psychosis not due to a substance or known physiological condition: Principal | ICD-10-CM

## 2024-07-04 DIAGNOSIS — Z8249 Family history of ischemic heart disease and other diseases of the circulatory system: Secondary | ICD-10-CM

## 2024-07-04 LAB — ETHANOL: Alcohol, Ethyl (B): <15 mg/dL (ref <15)

## 2024-07-04 LAB — CBC WITH DIFFERENTIAL/PLATELET
Abs Immature Granulocytes: 0.06 K/uL (ref 0.00–0.07)
Basophils Absolute: 0.1 K/uL (ref 0.0–0.1)
Basophils Relative: 1 %
Eosinophils Absolute: 0.1 K/uL (ref 0.0–0.5)
Eosinophils Relative: 1 %
HCT: 44.4 % (ref 36.0–46.0)
Hemoglobin: 14.4 g/dL (ref 12.0–15.0)
Immature Granulocytes: 1 %
Lymphocytes Relative: 23 %
Lymphs Abs: 2.7 K/uL (ref 0.7–4.0)
MCH: 27.7 pg (ref 26.0–34.0)
MCHC: 32.4 g/dL (ref 30.0–36.0)
MCV: 85.5 fL (ref 80.0–100.0)
Monocytes Absolute: 0.6 K/uL (ref 0.1–1.0)
Monocytes Relative: 5 %
Neutro Abs: 8.3 K/uL — ABNORMAL HIGH (ref 1.7–7.7)
Neutrophils Relative %: 69 %
Platelets: 304 K/uL (ref 150–400)
RBC: 5.19 MIL/uL — ABNORMAL HIGH (ref 3.87–5.11)
RDW: 12.8 % (ref 11.5–15.5)
WBC: 11.7 K/uL — ABNORMAL HIGH (ref 4.0–10.5)
nRBC: 0 % (ref 0.0–0.2)

## 2024-07-04 LAB — COMPREHENSIVE METABOLIC PANEL WITH GFR
ALT: 14 U/L (ref 0–44)
AST: 18 U/L (ref 15–41)
Albumin: 4.3 g/dL (ref 3.5–5.0)
Alkaline Phosphatase: 59 U/L (ref 38–126)
Anion gap: 9 (ref 5–15)
BUN: 9 mg/dL (ref 8–23)
CO2: 27 mmol/L (ref 22–32)
Calcium: 10.5 mg/dL — ABNORMAL HIGH (ref 8.9–10.3)
Chloride: 106 mmol/L (ref 98–111)
Creatinine, Ser: 0.54 mg/dL (ref 0.44–1.00)
GFR, Estimated: >60 mL/min (ref >60)
Glucose, Bld: 101 mg/dL — ABNORMAL HIGH (ref 70–99)
Potassium: 3.9 mmol/L (ref 3.5–5.1)
Sodium: 142 mmol/L (ref 135–145)
Total Bilirubin: 0.6 mg/dL (ref 0.0–1.2)
Total Protein: 7.7 g/dL (ref 6.5–8.1)

## 2024-07-04 LAB — URINE DRUG SCREEN, QUALITATIVE (ARMC ONLY)
Amphetamines, Ur Screen: NOT DETECTED
Barbiturates, Ur Screen: NOT DETECTED
Benzodiazepine, Ur Scrn: NOT DETECTED
Cannabinoid 50 Ng, Ur ~~LOC~~: POSITIVE — AB
Cocaine Metabolite,Ur ~~LOC~~: NOT DETECTED
MDMA (Ecstasy)Ur Screen: NOT DETECTED
Methadone Scn, Ur: NOT DETECTED
Opiate, Ur Screen: NOT DETECTED
Phencyclidine (PCP) Ur S: NOT DETECTED
Tricyclic, Ur Screen: NOT DETECTED

## 2024-07-04 MED ORDER — NICOTINE 14 MG/24HR TD PT24
14.0000 mg | MEDICATED_PATCH | Freq: Every day | TRANSDERMAL | Status: DC
  Administered 2024-07-04 – 2024-07-11 (×8): 14 mg via TRANSDERMAL
  Filled 2024-07-04 (×7): qty 1

## 2024-07-04 MED ORDER — ALUM & MAG HYDROXIDE-SIMETH 200-200-20 MG/5ML PO SUSP: 30.0000 mL | ORAL | Status: DC | PRN

## 2024-07-04 MED ORDER — DIPHENHYDRAMINE HCL 50 MG/ML IJ SOLN: 50.0000 mg | Freq: Three times a day (TID) | INTRAMUSCULAR | Status: DC | PRN

## 2024-07-04 MED ORDER — IBUPROFEN 600 MG PO TABS
800.0000 mg | ORAL_TABLET | Freq: Once | ORAL | Status: AC
Start: 2024-07-04 — End: 2024-07-04
  Administered 2024-07-04: 800 mg via ORAL
  Filled 2024-07-04: qty 1

## 2024-07-04 MED ORDER — LORAZEPAM 2 MG/ML IJ SOLN
2.0000 mg | Freq: Three times a day (TID) | INTRAMUSCULAR | Status: DC | PRN
  Filled 2024-07-04: qty 1

## 2024-07-04 MED ORDER — MAGNESIUM HYDROXIDE 400 MG/5ML PO SUSP
30.0000 mL | Freq: Every day | ORAL | Status: DC | PRN
  Administered 2024-07-10: 30 mL via ORAL
  Filled 2024-07-04: qty 30

## 2024-07-04 MED ORDER — TRAZODONE HCL 50 MG PO TABS
50.0000 mg | ORAL_TABLET | Freq: Every evening | ORAL | Status: DC | PRN
  Administered 2024-07-04 – 2024-07-10 (×7): 50 mg via ORAL
  Filled 2024-07-04 (×6): qty 1

## 2024-07-04 MED ORDER — HALOPERIDOL 5 MG PO TABS
5.0000 mg | ORAL_TABLET | Freq: Three times a day (TID) | ORAL | Status: DC | PRN
  Administered 2024-07-06: 5 mg via ORAL
  Filled 2024-07-04: qty 1

## 2024-07-04 MED ORDER — OXYCODONE HCL 5 MG PO TABS
5.0000 mg | ORAL_TABLET | Freq: Three times a day (TID) | ORAL | Status: DC | PRN
  Administered 2024-07-04 – 2024-07-11 (×20): 5 mg via ORAL
  Filled 2024-07-04 (×20): qty 1

## 2024-07-04 MED ORDER — LISINOPRIL 5 MG PO TABS
10.0000 mg | ORAL_TABLET | Freq: Every day | ORAL | Status: DC
Start: 2024-07-04 — End: 2024-07-08
  Administered 2024-07-04 – 2024-07-08 (×5): 10 mg via ORAL
  Filled 2024-07-04 (×5): qty 2

## 2024-07-04 MED ORDER — DIPHENHYDRAMINE HCL 25 MG PO CAPS
50.0000 mg | ORAL_CAPSULE | Freq: Three times a day (TID) | ORAL | Status: DC | PRN
  Administered 2024-07-06: 50 mg via ORAL
  Filled 2024-07-04: qty 2

## 2024-07-04 MED ORDER — TRAZODONE HCL 50 MG PO TABS
50.0000 mg | ORAL_TABLET | Freq: Every day | ORAL | Status: DC
  Administered 2024-07-04: 50 mg via ORAL
  Filled 2024-07-04: qty 1

## 2024-07-04 MED ORDER — HALOPERIDOL LACTATE 5 MG/ML IJ SOLN: 10.0000 mg | Freq: Three times a day (TID) | INTRAMUSCULAR | Status: DC | PRN

## 2024-07-04 MED ORDER — HYDROXYZINE HCL 25 MG PO TABS
25.0000 mg | ORAL_TABLET | Freq: Three times a day (TID) | ORAL | Status: DC | PRN
  Administered 2024-07-04 – 2024-07-11 (×19): 25 mg via ORAL
  Filled 2024-07-04 (×20): qty 1

## 2024-07-04 MED ORDER — HALOPERIDOL LACTATE 5 MG/ML IJ SOLN: 5.0000 mg | Freq: Three times a day (TID) | INTRAMUSCULAR | Status: DC | PRN

## 2024-07-04 NOTE — ED Notes (Signed)
 Assumed care of patient, pt resting with eyes shut, respirations even and unlabored, no distress noted

## 2024-07-04 NOTE — ED Notes (Signed)
Attempted report x 1, no answer 

## 2024-07-04 NOTE — BH Assessment (Signed)
 Comprehensive Clinical Assessment (CCA) Note  07/04/2024 Lori [name] [id number] Recommendations for Services/Supports/Treatments: Consulted with NP Jon HERO., who recommended pt. for inpatient treatment.   Lori [name] is a 63 year old, English speaking, Caucasian female. Pt presented to [facility] ED under IVC. Per triage note: Pt to ED via [facility] under IVC, per papers pt was on a video chat with provider who noted pt to be disorganized and fixated on delusions that she is being targeted by aliens and the [employer]. She is having auditory hallucinations of aliens controlling her mind. Pt talking about ufos in triage. Pt denies SI/HI. Pt calm and cooperative  Pt had disorganized speech and flight of ideas as the assessment progressed. Pt had no insight into why her outpatient provider recommended that she come to the hospital for an eval. Pt was calm and cooperative throughout assessment. Pt stated, "I don't know why he sent me here, I don't want to hurt myself or others all I said was that I'd been seeing UFO's and I did." Pt presented with a euthymic mood and a congruent affect. Pt had a disheveled appearance and made good eye contact. Pt admitted to stopping her bipolar medications a couple of weeks ago due to having adverse side effects such as severe scalp irritation. Pt was adamant that she'd had no change in functioning since stopping her medications. Pt was preoccupied with paranoid delusions of God/FBI beaming things at her and being aware of seven words. Pt had poor insight and judgement. The patient denied current thoughts of SI/HI or AV/H. Pt admitted to smoking nicotine  and ingesting cannabis gummies as she'd been given permission from her doctor to do so.  Chief Complaint:  Chief Complaint  Patient presents with   Psychiatric Evaluation   Visit Diagnosis: Bipolar 1 d/o    CCA Screening, Triage and Referral (STR)  Patient Reported Information How did you hear about us ? Legal  System  Referral name: No data recorded Referral phone number: No data recorded  Whom do you see for routine medical problems? No data recorded Practice/Facility Name: No data recorded Practice/Facility Phone Number: No data recorded Name of Contact: No data recorded Contact Number: No data recorded Contact Fax Number: No data recorded Prescriber Name: No data recorded Prescriber Address (if known): No data recorded  What Is the Reason for Your Visit/Call Today? Erratic behavior  How Long Has This Been Causing You Problems? > than 6 months  What Do You Feel Would Help You the Most Today? Treatment for Depression or other mood problem   Have You Recently Been in Any Inpatient Treatment (Hospital/Detox/Crisis Center/28-Day Program)? No data recorded Name/Location of Program/Hospital:No data recorded How Long Were You There? No data recorded When Were You Discharged? No data recorded  Have You Ever Received Services From [facility] Before? No data recorded Who Do You See at [facility]? No data recorded  Have You Recently Had Any Thoughts About Hurting Yourself? No  Are You Planning to Commit Suicide/Harm Yourself At This time? No   Have you Recently Had Thoughts About Hurting Someone [place]? No  Explanation: No data recorded  Have You Used Any Alcohol or Drugs in the Past 24 Hours? No  How Long Ago Did You Use Drugs or Alcohol? No data recorded What Did You Use and How Much? No data recorded  Do You Currently Have a Therapist/Psychiatrist? No  Name of Therapist/Psychiatrist: No data recorded  Have You Been Recently Discharged From Any Office Practice or Programs? No  Explanation of Discharge From Practice/Program: No data recorded    CCA Screening Triage Referral Assessment Type of Contact: Face-to-Face  Is this Initial or Reassessment? No data recorded Date Telepsych consult ordered in [facility]:  No data recorded Time Telepsych consult ordered in [facility]:  No data  recorded  Patient Reported Information Reviewed? No data recorded Patient Left Without Being Seen? No data recorded Reason for Not Completing Assessment: No data recorded  Collateral Involvement: No collateral involved   Does Patient Have a Court Appointed Legal Guardian? No data recorded Name and Contact of Legal Guardian: No data recorded If Minor and Not Living with Parent(s), Who has Custody? No data recorded Is CPS involved or ever been involved? Never  Is APS involved or ever been involved? Never   Patient Determined To Be At Risk for Harm To Self or Others Based on Review of Patient Reported Information or Presenting Complaint? No  Method: No Plan  Availability of Means: No access or NA  Intent: Vague intent or NA  Notification Required: No need or identified person  Additional Information for Danger to Others Potential: Active psychosis  Additional Comments for Danger to Others Potential: n/a  Are There Guns or Other Weapons in Your Home? No  Types of Guns/Weapons: No guns or weapons in the home  Are These Weapons Safely Secured?                            -- (n/a)  Who Could Verify You Are Able To Have These Secured: n/a  Do You Have any Outstanding Charges, Pending Court Dates, Parole/Probation? No data recorded Contacted To Inform of Risk of Harm To Self or Others: No data recorded  Location of Assessment: [facility] ED   Does Patient Present under Involuntary Commitment? Yes  IVC Papers Initial File Date: No data recorded  [place] of Residence: [place]   Patient Currently Receiving the Following Services: Not Receiving Services   Determination of Need: Emergent (2 hours)   Options For Referral: Inpatient Hospitalization     CCA Biopsychosocial Intake/Chief Complaint:  No data recorded Current Symptoms/Problems: No data recorded  Patient Reported Schizophrenia/Schizoaffective Diagnosis in Past: Yes   Strengths: Patient is able to communicate  and complete her ADLs  Preferences: No data recorded Abilities: No data recorded  Type of Services Patient Feels are Needed: No data recorded  Initial Clinical Notes/Concerns: No data recorded  Mental Health Symptoms Depression:  None   Duration of Depressive symptoms: No data recorded  Mania:  Change in energy/activity; Increased Energy; Irritability; Racing thoughts; Recklessness   Anxiety:   Difficulty concentrating; Restlessness; Worrying; Tension; Sleep   Psychosis:  Delusions; Grossly disorganized or catatonic behavior; Grossly disorganized speech; Hallucinations   Duration of Psychotic symptoms: -- (Unknown)   Trauma:  -- (UTA)   Obsessions:  Cause anxiety; Poor insight; Recurrent & persistent thoughts/impulses/images   Compulsions:  Absent insight/delusional; Poor Insight; Repeated behaviors/mental acts; Driven to perform behaviors/acts   Inattention:  Disorganized   Hyperactivity/Impulsivity:  N/A   Oppositional/Defiant Behaviors:  N/A   Emotional Irregularity:  N/A   Other Mood/Personality Symptoms:  Anxious/Delusional    Mental Status Exam Appearance and self-care  Stature:  Average   Weight:  Average weight   Clothing:  Disheveled   Grooming:  Neglected   Cosmetic use:  None   Posture/gait:  Tense   Motor activity:  Restless   Sensorium  Attention:  Distractible; Inattentive; Unaware   Concentration:  Focuses on irrelevancies; Preoccupied; Scattered   Orientation:  Place; Person   Recall/memory:  Defective in Recent; Defective in Remote   Affect and Mood  Affect:  Anxious; Labile   Mood:  Anxious; Hypomania   Relating  Eye contact:  Avoided   Facial expression:  Anxious   Attitude toward examiner:  Cooperative   Thought and Language  Speech flow: Flight of Ideas   Thought content:  Delusions; Persecutions   Preoccupation:  Ruminations   Hallucinations:  -- (Patient denies but can be seen pacing in her room and talking to  herself)   Organization:  No data recorded  [employer] of Knowledge:  Fair   Intelligence:  Average   Abstraction:  Functional   Judgement:  Poor   Reality Testing:  Distorted; Unaware   Insight:  Poor; None/zero insight   Decision Making:  Impulsive   Social Functioning  Social Maturity:  Impulsive   Social Judgement:  Heedless   Stress  Stressors:  Illness   Coping Ability:  Exhausted   Skill Deficits:  None   Supports:  Support needed     Religion:    Leisure/Recreation:    Exercise/Diet:     CCA Employment/Education Employment/Work Situation:    Education:     CCA Family/Childhood History Family and Relationship History:    Childhood History:     Child/Adolescent Assessment:     CCA Substance Use Alcohol/Drug Use:                           ASAM's:  Six Dimensions of Multidimensional Assessment  Dimension 1:  Acute Intoxication and/or Withdrawal Potential:      Dimension 2:  Biomedical Conditions and Complications:      Dimension 3:  Emotional, Behavioral, or Cognitive Conditions and Complications:     Dimension 4:  Readiness to Change:     Dimension 5:  Relapse, Continued use, or Continued Problem Potential:     Dimension 6:  Recovery/Living Environment:     ASAM Severity Score:    ASAM Recommended Level of Treatment:     Substance use Disorder (SUD)    Recommendations for Services/Supports/Treatments:    DSM5 Diagnoses: Patient Active Problem List   Diagnosis Date Noted   Psychosis, unspecified psychosis type (HCC) 09/18/2023   Mania (HCC) 09/16/2023   Major depressive disorder, recurrent episode, severe, with psychotic behavior (HCC) 03/25/2019   Anxiety 03/24/2019   Encounter for chronic pain management 04/08/2017   Essential hypertension 08/19/2016   Hormone replacement therapy (HRT) 12/15/2015   Primary osteoarthritis of right knee 12/15/2015   Migraine without aura and with status  migrainosus, not intractable 11/28/2015   Non-intractable cyclical vomiting with nausea 11/28/2015   Current smoker 07/17/2015   Fatty liver 09/01/2012   Renal cyst, right 01/14/2012   OCD (obsessive compulsive disorder) 12/23/2011   DYSTROPHIC NAILS 05/25/2010   Vitamin D  deficiency 05/05/2010   Depression 12/11/2008   Tear film insufficiency 12/04/2007   POLYARTHRALGIA 12/04/2007   Hyperlipidemia 09/05/2007   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

## 2024-07-04 NOTE — ED Provider Notes (Signed)
 Notified by RN that patient did not have labs ordered on presentation.  These were ordered with CBC with mild leukocytosis, normal hemoglobin, CMP without critical derangement.  Negative EtOH level.  UDS positive for THC.  Do think patient can be medically cleared for psychiatric evaluation.   Levander Slate, MD 07/04/24 [phone]

## 2024-07-04 NOTE — ED Notes (Signed)
 Attempted report x1, placed on hold with no answer
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
## 2024-07-04 NOTE — Group Note (Signed)
 Date:  07/04/2024 Time:  8:44 PM  Group Topic/Focus:  Wrap-Up Group:   The focus of this group is to help patients review their daily goal of treatment and discuss progress on daily workbooks.    Participation Level:  Did Not Attend   Additional Comments:   Arlester CHRISTELLA Servant 07/04/2024, 8:44 PM

## 2024-07-04 NOTE — ED Notes (Signed)
 IVC/ Recommend psych inpt

## 2024-07-04 NOTE — Plan of Care (Signed)

## 2024-07-04 NOTE — ED Notes (Signed)
 Patient Pt. to [facility] from [facility] ambulatory without difficulty, to room  . Report from [facility]. Pt. Is alert and oriented, warm and dry in no distress. Pt. Denies SI, HI, and AVH. Pt. Calm and cooperative. Voiced that her new psychiatrist made her come to the hospital because she seen a UFO. Reports not taking her medications due to side effects. Pt. Made aware of security cameras and Q15 minute rounds. Pt. Encouraged to let Nursing staff know of any concerns or needs.

## 2024-07-04 NOTE — ED Notes (Signed)
 Pt lunch provided at bedside

## 2024-07-04 NOTE — ED Notes (Signed)
 Pt breakfast provided at bedside

## 2024-07-04 NOTE — ED Provider Notes (Signed)
 5:04 AM  Pt Accepted [facility]U L-16 by Jon Crimes Np. Dr Donnelly is the attending. Dx Bipolar 1 D/O.    Aysa Larivee, Josette SAILOR, DO 07/04/24 [phone]
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
## 2024-07-04 NOTE — ED Notes (Signed)
 Pt discharged/transferred to BMU room 316. Transferred via wheelchair and accompanied by [facility] staff and NT

## 2024-07-04 NOTE — BH Assessment (Signed)
 Patient is to be admitted to [facility] by Psychiatric Nurse Practitioner Jon HERO.  Attending Physician will be Dr. Jadapalle.   Patient has been assigned to room 316, by [facility] Charge Nurse Donnice.   Intake Paper Work has been signed and placed on patient chart.  ER staff is aware of the admission: Darryle, ER Secretary   Dr. Neomi, ER MD  Jazmine, Patient's Nurse  Jinnie, Patient Access.   Pt Accepted [facility] BMU Room 316 07/04/24 after 8AM.

## 2024-07-05 MED ORDER — IBUPROFEN 600 MG PO TABS
600.0000 mg | ORAL_TABLET | Freq: Four times a day (QID) | ORAL | Status: DC | PRN
  Administered 2024-07-05 – 2024-07-11 (×8): 600 mg via ORAL
  Filled 2024-07-05 (×8): qty 1

## 2024-07-05 NOTE — [facility] Suicide Risk Assessment (Signed)
 [facility] INPATIENT:  Family/Significant Other Suicide Prevention Education  Suicide Prevention Education:  Patient Refusal for Family/Significant Other Suicide Prevention Education: The patient Lori [name] has refused to provide written consent for family/significant other to be provided Family/Significant Other Suicide Prevention Education during admission and/or prior to discharge.  Physician notified.  SPE completed with pt, as pt refused to consent to family contact. SPI pamphlet provided to pt and pt was encouraged to share information with support network, ask questions, and talk about any concerns relating to SPE. Pt denies access to guns/firearms and verbalized understanding of information provided. Mobile Crisis information also provided to pt.    Carlisa Eble M Tremond Shimabukuro 07/05/2024, 10:18 AM

## 2024-07-05 NOTE — Progress Notes (Signed)
   07/05/24 1600  Psych Admission Type (Psych Patients Only)  Admission Status Involuntary  Psychosocial Assessment  Patient Complaints Anxiety  Eye Contact Fair  Facial Expression Anxious  Affect Anxious  Speech Unremarkable  Interaction Assertive  Motor Activity Slow  Appearance/Hygiene Disheveled;In scrubs  Behavior Characteristics Cooperative;Anxious  Mood Anxious;Pleasant  Thought Process  Coherency WDL  Content Blaming others  Delusions Persecutory  Perception WDL  Hallucination None reported or observed  Judgment Impaired  Confusion None  Danger to Self  Current suicidal ideation? Denies  Danger to Others  Danger to Others None reported or observed

## 2024-07-05 NOTE — Plan of Care (Signed)

## 2024-07-05 NOTE — Group Note (Signed)
 [facility] LCSW Group Therapy Note   Group Date: 07/05/2024 Start Time: 1300 End Time: 1400   Type of Therapy/Topic:  Group Therapy:  Balance in Life  Participation Level:  Minimal   Description of Group:    This group will address the concept of balance and how it feels and looks when one is unbalanced. Patients will be encouraged to process areas in their lives that are out of balance, and identify reasons for remaining unbalanced. Facilitators will guide patients utilizing problem- solving interventions to address and correct the stressor making their life unbalanced. Understanding and applying boundaries will be explored and addressed for obtaining  and maintaining a balanced life. Patients will be encouraged to explore ways to assertively make their unbalanced needs known to significant others in their lives, using other group members and facilitator for support and feedback.  Therapeutic Goals: Patient will identify two or more emotions or situations they have that consume much of in their lives. Patient will identify signs/triggers that life has become out of balance:  Patient will identify two ways to set boundaries in order to achieve balance in their lives:  Patient will demonstrate ability to communicate their needs through discussion and/or role plays  Summary of Patient Progress: Patient was present for part of the group. She participated in the icebreaker and a bit in the larger discussion. Pt left group early as she was tired and kept falling asleep during the discussion. Insight into the topic remains questionable.    Therapeutic Modalities:   Cognitive Behavioral Therapy Solution-Focused Therapy Assertiveness Training   Nadara JONELLE Fam, LCSW

## 2024-07-05 NOTE — Group Note (Signed)
 Recreation Therapy Group Note   Group Topic:General Recreation  Group Date: 07/05/2024 Start Time: 1000 End Time: 1050 Facilitators: Celestia Jeoffrey BRAVO, LRT, CTRS Location: Courtyard  Group Description: [employer]. LRT and patients played games of basketball, drew with chalk, and played corn hole while outside in the courtyard while getting fresh air and sunlight. Music was being played in the background. LRT and peers conversed about different games they have played before, what they do in their free time and anything else that is on their minds. LRT encouraged pts to drink water after being outside, sweating and getting their heart rate up.  Goal Area(s) Addressed: Patient will build on frustration tolerance skills. Patients will partake in a competitive play game with peers. Patients will gain knowledge of new leisure interest/hobby.     Affect/Mood: Flat   Participation Level: Moderate   Participation Quality: Independent   Behavior: Calm   Speech/Thought Process: Coherent   Insight: Fair   Judgement: Fair    Modes of Intervention: Activity   Patient Response to Interventions:  Receptive   Education Outcome:  In group clarification offered    Clinical Observations/Individualized Feedback: Marquisa was mostly active in their participation of session activities and group discussion. Pt minimally interacted with LRT and peers while present.    Plan: Continue to engage patient in RT group sessions 2-3x/week.   Jeoffrey BRAVO Celestia, LRT, CTRS 07/05/2024 11:12 AM

## 2024-07-05 NOTE — Group Note (Signed)
 Date:  07/05/2024 Time:  7:14 PM  Group Topic/Focus:  Goals Group:   The focus of this group is to help patients establish daily goals to achieve during treatment and discuss how the patient can incorporate goal setting into their daily lives to aide in recovery.    Participation Level:  Active  Participation Quality:  Appropriate  Affect:  Appropriate  Cognitive:  Appropriate  Insight: Appropriate  Engagement in Group:  Engaged  Modes of Intervention:  Discussion, Education, and Support  Additional Comments:    Deitra Caron Mainland 07/05/2024, 7:14 PM

## 2024-07-05 NOTE — Group Note (Signed)
 Date:  07/05/2024 Time:  9:04 PM  Group Topic/Focus:  Wrap-Up Group:   The focus of this group is to help patients review their daily goal of treatment and discuss progress on daily workbooks.    Participation Level:  Active  Participation Quality:  Appropriate  Affect:  Appropriate  Cognitive:  Appropriate  Insight: Appropriate  Engagement in Group:  Engaged  Modes of Intervention:  Discussion  Additional Comments:    Beatris ONEIDA Hasten 07/05/2024, 9:04 PM
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
## 2024-07-05 NOTE — Group Note (Signed)
 Recreation Therapy Group Note   Group Topic:Relaxation  Group Date: 07/05/2024 Start Time: 1530 End Time: 1630 Facilitators: Celestia Jeoffrey BRAVO, LRT, CTRS Location: Courtyard  Group Description: Mindfulness Body Scan. LRT educated on the benefits of mindfulness and how it can apply to everyday life post-discharge. LRT and pt's followed along to an audio script of a "mindfulness body scan" video.   Goal Area(s) Addressed: Patient will practice using relaxation technique. Patient will identify a new coping skill.  Patient will follow multistep directions to reduce anxiety and stress.   Affect/Mood: Flat   Participation Level: Moderate    Clinical Observations/Individualized Feedback: Lori [name] was present in group. Pt seemed focused on receiving meds and what time it was.   Plan: Continue to engage patient in RT group sessions 2-3x/week.   Jeoffrey BRAVO Celestia, LRT, CTRS 07/05/2024 5:17 PM
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
## 2024-07-05 NOTE — Plan of Care (Signed)
   Problem: Education: Goal: Knowledge of [facility] Education information/materials will improve Outcome: Progressing Goal: Emotional status will improve Outcome: Progressing Goal: Mental status will improve Outcome: Progressing Goal: Verbalization of understanding the information provided will improve Outcome: Progressing

## 2024-07-06 DIAGNOSIS — F319 Bipolar disorder, unspecified: Secondary | ICD-10-CM | POA: Diagnosis not present

## 2024-07-06 MED ORDER — MELATONIN 5 MG PO TABS
5.0000 mg | ORAL_TABLET | Freq: Every day | ORAL | Status: DC
  Administered 2024-07-06 – 2024-07-10 (×5): 5 mg via ORAL
  Filled 2024-07-06 (×5): qty 1

## 2024-07-06 MED ORDER — ARIPIPRAZOLE 5 MG PO TABS
5.0000 mg | ORAL_TABLET | Freq: Every day | ORAL | Status: DC
  Administered 2024-07-06 – 2024-07-11 (×6): 5 mg via ORAL
  Filled 2024-07-06 (×6): qty 1

## 2024-07-06 MED ORDER — EZETIMIBE 10 MG PO TABS
10.0000 mg | ORAL_TABLET | Freq: Every day | ORAL | Status: DC
  Administered 2024-07-06 – 2024-07-11 (×6): 10 mg via ORAL
  Filled 2024-07-06 (×6): qty 1

## 2024-07-06 MED ORDER — ESTROGENS CONJUGATED 0.45 MG PO TABS
0.4500 mg | ORAL_TABLET | Freq: Every day | ORAL | Status: DC
  Administered 2024-07-06 – 2024-07-11 (×6): 0.45 mg via ORAL
  Filled 2024-07-06 (×6): qty 1
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
## 2024-07-06 NOTE — Plan of Care (Signed)

## 2024-07-06 NOTE — Group Note (Signed)
 Recreation Therapy Group Note   Group Topic:General Recreation  Group Date: 07/06/2024 Start Time: 1515 End Time: 1615 Facilitators: Celestia Jeoffrey BRAVO, LRT, CTRS Location: Courtyard  Group Description: [employer]. LRT and patients played games of basketball, drew with chalk, and played corn hole while outside in the courtyard while getting fresh air and sunlight. Music was being played in the background. LRT and peers conversed about different games they have played before, what they do in their free time and anything else that is on their minds. LRT encouraged pts to drink water after being outside, sweating and getting their heart rate up.  Goal Area(s) Addressed: Patient will build on frustration tolerance skills. Patients will partake in a competitive play game with peers. Patients will gain knowledge of new leisure interest/hobby.    Affect/Mood: Appropriate   Participation Level: Active   Participation Quality: Independent   Behavior: Appropriate   Speech/Thought Process: Coherent   Insight: Good   Judgement: Good   Modes of Intervention: Activity   Patient Response to Interventions:  Receptive   Education Outcome:  Acknowledges education   Clinical Observations/Individualized Feedback: Joyell was active in their participation of session activities and group discussion. Pt interacted well with LRT and peers duration of session.    Plan: Continue to engage patient in RT group sessions 2-3x/week.   Jeoffrey BRAVO Celestia, LRT, CTRS 07/06/2024 5:15 PM

## 2024-07-06 NOTE — BHH Group Notes (Signed)
 Spirituality Group   Group Goal: Support / Education around grief and loss   **Needs of group concerned grief and trauma recovery specifically  Group Description: Following introductions and group rules, group members engaged in facilitated group dialog and support around topic of loss, with particular support around experiences of loss in their lives. Group members identified types of loss (relationships / self / things) as well as patterns, circumstances, and changes that precipitate loss. Reflection invited on thoughts / feelings around loss, normalized grief responses, and recognized variety in grief experience. Group noted Worden's four tasks of grief in discussion. Group drew on Adlerian / Rogerian, narrative, MI, with Yalom's group therapy as a primary framework.   Observations: Toneka made an effort to be present and to engage with the group discussion. Her way of engaging was rooted in delusion. This chaplain did not attempt to enter that delusion but offered to engage one on one at a later time to help Dorna feel seen and heard.  Thai Hemrick L. Delores HERO.Div

## 2024-07-06 NOTE — Progress Notes (Signed)
   07/06/24 0014  Psych Admission Type (Psych Patients Only)  Admission Status Involuntary  Psychosocial Assessment  Patient Complaints Anxiety  Eye Contact Fair  Facial Expression Anxious  Affect Irritable;Anxious  Speech Pressured  Interaction Assertive  Motor Activity Slow  Appearance/Hygiene Disheveled;In scrubs  Behavior Characteristics Cooperative  Mood Anxious  Aggressive Behavior  Effect No apparent injury  Thought Process  Coherency WDL  Content WDL  Delusions WDL  Perception WDL  Hallucination None reported or observed  Judgment Impaired  Confusion WDL  Danger to Self  Current suicidal ideation? Denies  Danger to Others  Danger to Others None reported or observed

## 2024-07-06 NOTE — Plan of Care (Signed)
   Problem: Education: Goal: Knowledge of [facility] Education information/materials will improve Outcome: Progressing Goal: Emotional status will improve Outcome: Progressing Goal: Mental status will improve Outcome: Progressing Goal: Verbalization of understanding the information provided will improve Outcome: Progressing

## 2024-07-06 NOTE — Group Note (Signed)
 Recreation Therapy Group Note   Group Topic:Leisure Education  Group Date: 07/06/2024 Start Time: 1020 End Time: 1130 Facilitators: Celestia Jeoffrey BRAVO, LRT, CTRS Location: Craft Room  Group Description: Leisure. Patients were given the option to choose from journaling, coloring, drawing, making origami, playing with playdoh, listening to music or singing karaoke. LRT and pts discussed the meaning of leisure, the importance of participating in leisure during their free time/when they're outside of the hospital, as well as how our leisure interests can also serve as coping skills.   Goal Area(s) Addressed:  Patient will identify a current leisure interest.  Patient will learn the definition of "leisure". Patient will practice making a positive decision. Patient will have the opportunity to try a new leisure activity. Patient will communicate with peers and LRT.    Affect/Mood: Blunted and Flat   Participation Level: Active and Engaged   Participation Quality: Independent   Behavior: Calm and Cooperative   Speech/Thought Process: Coherent   Insight: Fair   Judgement: Good   Modes of Intervention: Clarification, Education, Exploration, and Music   Patient Response to Interventions:  Attentive and Receptive   Education Outcome:  In group clarification offered    Clinical Observations/Individualized Feedback: Lori [name] was active in their participation of session activities and group discussion. Pt identified be out in nature and look up at the sky as things she does in her free time. Pt chose to color while in group.    Plan: Continue to engage patient in RT group sessions 2-3x/week.   Jeoffrey BRAVO Celestia, LRT, CTRS 07/06/2024 1:02 PM

## 2024-07-06 NOTE — [facility] Suicide Risk Assessment (Signed)
 [facility] Admission Suicide Risk Assessment   Nursing information obtained from:  Patient Demographic factors:  Divorced or widowed, Caucasian, Low socioeconomic status, Unemployed Current Mental Status:  NA Loss Factors:  Decrease in vocational status, Legal issues, Financial problems / change in socioeconomic status Historical Factors:  NA Risk Reduction Factors:  NA  Total Time spent with patient: 30 minutes Principal Problem: Bipolar 1 disorder (HCC) Diagnosis:  Principal Problem:   Bipolar 1 disorder (HCC)  Subjective Data: Lori [name] is a 63 y.o. female admitted: Presented to the EDfor 07/03/2024  7:51 PM for delusions, hallucinations, and disorganized thought processes in the setting of medication non-adherence. She carries the psychiatric diagnoses of bipolar disorder with psychotic features and has a past medical history of tobacco use and medication intolerance (Seroquel ).  Her current presentation of persecutory delusions, disorganized thought process, pressured speech, and recent hallucinations is most consistent with acute manic or psychotic exacerbation of bipolar disorder. She meets criteria for inpatient psychiatric admission based on impaired reality and risk of further decompensation if untreated.     Continued Clinical Symptoms:  Alcohol Use Disorder Identification Test Final Score (AUDIT): 1 The Alcohol Use Disorders Identification Test, Guidelines for Use in Primary Care, Second Edition.  World [employer] writer [employer]). Score between 0-7:  no or low risk or alcohol related problems. Score between 8-15:  moderate risk of alcohol related problems. Score between 16-19:  high risk of alcohol related problems. Score 20 or above:  warrants further diagnostic evaluation for alcohol dependence and treatment.   CLINICAL FACTORS:   Bipolar Disorder:   Mixed State   Musculoskeletal: Strength & Muscle Tone: within normal limits Gait & Station: normal Patient leans:  N/A  Psychiatric Specialty Exam:  Presentation  General Appearance:  Appropriate for Environment  Eye Contact: Good  Speech: Normal Rate  Speech Volume: Normal  Handedness: Right   Mood and Affect  Mood: Depressed  Affect: Constricted   Thought Process  Thought Processes: Linear  Descriptions of Associations:Intact  Orientation:Full (Time, Place and Person)  Thought Content:Logical  History of Schizophrenia/Schizoaffective disorder:Yes  Duration of Psychotic Symptoms:-- (Unknown)  Hallucinations:Hallucinations: None  Ideas of Reference:None  Suicidal Thoughts:Suicidal Thoughts: No  Homicidal Thoughts:Homicidal Thoughts: No   Sensorium  Memory: Immediate Good  Judgment: Fair  Insight: Fair   Art therapist  Concentration: Fair  Attention Span: Fair  Recall: Fair  Fund of Knowledge: Fair  Language: Fair   Psychomotor Activity  Psychomotor Activity: Psychomotor Activity: Decreased   Assets  Assets: Desire for Improvement   Sleep  Sleep: Sleep: Fair Number of Hours of Sleep: 6    Physical Exam: Physical Exam Vitals and nursing note reviewed.    ROS Blood pressure 101/60, pulse 82, temperature 97.8 F (36.6 C), temperature source Oral, resp. rate 16, height 5' 7 (1.702 m), weight 64.9 kg, SpO2 100%. Body mass index is 22.4 kg/m.   COGNITIVE FEATURES THAT CONTRIBUTE TO RISK:  None    SUICIDE RISK:   Minimal: No identifiable suicidal ideation.  Patients presenting with no risk factors but with morbid ruminations; may be classified as minimal risk based on the severity of the depressive symptoms  PLAN OF CARE: Patient is admitted to adult  psych unit with Q15 min safety monitoring. Multidisciplinary team approach is offered. Medication management; group/milieu therapy is offered.   I certify that inpatient services furnished can reasonably be expected to improve the patient's condition.   Allyn Foil, MD 07/06/2024, 10:06 PM

## 2024-07-06 NOTE — BH IP Treatment Plan (Signed)
 Interdisciplinary Treatment and Diagnostic Plan Update  07/06/2024 Time of Session: 10:09 AM Lori [name] MRN: [id number]  Principal Diagnosis: Bipolar 1 disorder (HCC)  Secondary Diagnoses: Principal Problem:   Bipolar 1 disorder (HCC)   Current Medications:  Current Facility-Administered Medications  Medication Dose Route Frequency Provider Last Rate Last Admin   alum & mag hydroxide-simeth (MAALOX/MYLANTA) 200-200-20 MG/5ML suspension 30 mL  30 mL Oral Q4H PRN McLauchlin, Angela, NP       haloperidol  (HALDOL ) tablet 5 mg  5 mg Oral TID PRN McLauchlin, Angela, NP       And   diphenhydrAMINE  (BENADRYL ) capsule 50 mg  50 mg Oral TID PRN McLauchlin, Angela, NP       haloperidol  lactate (HALDOL ) injection 5 mg  5 mg Intramuscular TID PRN McLauchlin, Angela, NP       And   diphenhydrAMINE  (BENADRYL ) injection 50 mg  50 mg Intramuscular TID PRN McLauchlin, Angela, NP       And   LORazepam  (ATIVAN ) injection 2 mg  2 mg Intramuscular TID PRN McLauchlin, Angela, NP       haloperidol  lactate (HALDOL ) injection 10 mg  10 mg Intramuscular TID PRN McLauchlin, Angela, NP       And   diphenhydrAMINE  (BENADRYL ) injection 50 mg  50 mg Intramuscular TID PRN McLauchlin, Angela, NP       And   LORazepam  (ATIVAN ) injection 2 mg  2 mg Intramuscular TID PRN McLauchlin, Angela, NP       hydrOXYzine  (ATARAX ) tablet 25 mg  25 mg Oral TID PRN McLauchlin, Angela, NP   25 mg at 07/06/24 0804   ibuprofen  (ADVIL ) tablet 600 mg  600 mg Oral Q6H PRN Uloko, Emmanuel, NP   600 mg at 07/05/24 2135   lisinopril  (ZESTRIL ) tablet 10 mg  10 mg Oral Daily Millington, Matthew E, PA-C   10 mg at 07/06/24 9196   magnesium  hydroxide (MILK OF MAGNESIA) suspension 30 mL  30 mL Oral Daily PRN McLauchlin, Angela, NP       nicotine  (NICODERM CQ  - dosed in mg/24 hours) patch 14 mg  14 mg Transdermal Daily Jadapalle, Sree, MD   14 mg at 07/06/24 0804   oxyCODONE  (Oxy IR/ROXICODONE ) immediate release tablet 5 mg  5 mg Oral Q8H PRN  Millington, Matthew E, PA-C   5 mg at 07/06/24 9045   traZODone  (DESYREL ) tablet 50 mg  50 mg Oral QHS PRN McLauchlin, Angela, NP   50 mg at 07/05/24 2135   PTA Medications: Medications Prior to Admission  Medication Sig Dispense Refill Last Dose/Taking   estrogens , conjugated, (PREMARIN ) 0.45 MG tablet Take 0.45 mg by mouth daily.  Take 1 tablet (0.45 mg total) by mouth daily. Take daily for 21 days then do not take for 7 days.   Past Week   ezetimibe  (ZETIA ) 10 MG tablet Take 1 tablet by mouth daily.   Past Week   gabapentin  (NEURONTIN ) 100 MG capsule Take 2 capsules (200 mg total) by mouth 2 (two) times daily. 120 capsule 3 Unknown   lisinopril  (ZESTRIL ) 5 MG tablet Take 10 mg by mouth daily.   Past Week   nicotine  (NICODERM CQ  - DOSED IN MG/24 HOURS) 21 mg/24hr patch Place 21 mg onto the skin daily.   Unknown   oxyCODONE  (OXY IR/ROXICODONE ) 5 MG immediate release tablet Take 5 mg by mouth 4 (four) times daily as needed.   Past Week   traZODone  (DESYREL ) 50 MG tablet Take 50 mg by mouth at  bedtime as needed.   Unknown   clonazePAM  (KLONOPIN ) 0.5 MG tablet Take 0.5 mg by mouth at bedtime. (Patient not taking: Reported on 07/04/2024)   Not Taking   divalproex (DEPAKOTE) 500 MG DR tablet Take 500 mg by mouth 2 (two) times daily. (Patient not taking: Reported on 07/04/2024)   Not Taking   LORazepam  (ATIVAN ) 0.5 MG tablet Take 1 tablet (0.5 mg total) by mouth every 4 (four) hours as needed for anxiety. (Patient not taking: Reported on 07/04/2024) 30 tablet 0 Not Taking   melatonin 3 MG TABS tablet Take 6 mg by mouth at bedtime. (Patient not taking: Reported on 07/04/2024)   Not Taking   Oxcarbazepine  (TRILEPTAL ) 300 MG tablet Take 1 tablet (300 mg total) by mouth 2 (two) times daily. (Patient not taking: Reported on 07/04/2024) 60 tablet 3 Not Taking   QUEtiapine  (SEROQUEL ) 100 MG tablet Take 1 tablet (100 mg total) by mouth daily. (Patient not taking: Reported on 07/04/2024) 30 tablet 3 Not Taking    traMADol  (ULTRAM ) 50 MG tablet TAKE 1 TABLET(50 MG) BY MOUTH EVERY 8 HOURS AS NEEDED (Patient not taking: Reported on 07/04/2024) 90 tablet 0 Not Taking   UZEDY 100 MG/0.28ML syringe  (Patient not taking: Reported on 07/04/2024)   Not Taking    Patient Stressors: Financial difficulties   Legal issue   Medication change or noncompliance   Occupational concerns    Patient Strengths: Capable of independent living   Treatment Modalities: Medication Management, Group therapy, Case management,  1 to 1 session with clinician, Psychoeducation, Recreational therapy.   Physician Treatment Plan for Primary Diagnosis: Bipolar 1 disorder (HCC) Long Term Goal(s):     Short Term Goals:    Medication Management: Evaluate patient's response, side effects, and tolerance of medication regimen.  Therapeutic Interventions: 1 to 1 sessions, Unit Group sessions and Medication administration.  Evaluation of Outcomes: Not Met  Physician Treatment Plan for Secondary Diagnosis: Principal Problem:   Bipolar 1 disorder (HCC)  Long Term Goal(s):     Short Term Goals:       Medication Management: Evaluate patient's response, side effects, and tolerance of medication regimen.  Therapeutic Interventions: 1 to 1 sessions, Unit Group sessions and Medication administration.  Evaluation of Outcomes: Not Met   RN Treatment Plan for Primary Diagnosis: Bipolar 1 disorder (HCC) Long Term Goal(s): Knowledge of disease and therapeutic regimen to maintain health will improve  Short Term Goals: Ability to verbalize frustration and anger appropriately will improve, Ability to demonstrate self-control, Ability to participate in decision making will improve, Ability to verbalize feelings will improve, Ability to disclose and discuss suicidal ideas, and Ability to identify and develop effective coping behaviors will improve  Medication Management: RN will administer medications as ordered by provider, will assess and  evaluate patient's response and provide education to patient for prescribed medication. RN will report any adverse and/or side effects to prescribing provider.  Therapeutic Interventions: 1 on 1 counseling sessions, Psychoeducation, Medication administration, Evaluate responses to treatment, Monitor vital signs and CBGs as ordered, Perform/monitor CIWA, COWS, AIMS and Fall Risk screenings as ordered, Perform wound care treatments as ordered.  Evaluation of Outcomes: Not Met   LCSW Treatment Plan for Primary Diagnosis: Bipolar 1 disorder (HCC) Long Term Goal(s): Safe transition to appropriate next level of care at discharge, Engage patient in therapeutic group addressing interpersonal concerns.  Short Term Goals: Engage patient in aftercare planning with referrals and resources, Increase social support, Increase ability to appropriately verbalize feelings, Increase  emotional regulation, Facilitate acceptance of mental health diagnosis and concerns, Facilitate patient progression through stages of change regarding substance use diagnoses and concerns, Identify triggers associated with mental health/substance abuse issues, and Increase skills for wellness and recovery  Therapeutic Interventions: Assess for all discharge needs, 1 to 1 time with Social worker, Explore available resources and support systems, Assess for adequacy in community support network, Educate family and significant other(s) on suicide prevention, Complete Psychosocial Assessment, Interpersonal group therapy.  Evaluation of Outcomes: Not Met   Progress in Treatment: Attending groups: Yes. Participating in groups: Yes. Taking medication as prescribed: Yes. Toleration medication: Yes. Family/Significant other contact made: No, will contact:  CSW to contact once permission is granted.  Patient understands diagnosis: Yes. Discussing patient identified problems/goals with staff: Yes. Medical problems stabilized or resolved:  Yes. Denies suicidal/homicidal ideation: Yes. Issues/concerns per patient self-inventory: No. Other: None  New problem(s) identified: No, Describe:  None  New Short Term/Long Term Goal(s):detox, elimination of symptoms of psychosis, medication management for mood stabilization; elimination of SI thoughts; development of comprehensive mental wellness/sobriety plan.    Patient Goals:  Continue to keep my system even.  Discharge Plan or Barriers: CSW to assist with the development of appropriate discharge plan.    Reason for Continuation of Hospitalization: Anxiety Delusions  Depression Suicidal ideation  Estimated Length of Stay: 1-7 days.   Last 3 [place] Suicide Severity Risk Score: Flowsheet Row Admission (Current) from 07/04/2024 in [facility] INPAT[facility] ED from 07/03/2024 in [facility] Emergency Department at [facility] ED from 03/16/2024 in [facility] Department at [facility]  C-SSRS RISK CATEGORY No Risk No Risk No Risk    Last PHQ 2/9 Scores:    01/24/2019    9:27 AM 10/27/2018    2:47 PM 08/28/2018    2:22 PM  Depression screen PHQ 2/9  Decreased Interest 0 0 0  Down, Depressed, Hopeless 1 1 0  PHQ - 2 Score 1 1 0  Altered sleeping 0 1 0  Tired, decreased energy 0 0 1  Change in appetite 0 0 0  Feeling bad or failure about yourself  1 0 0  Trouble concentrating 0 0 0  Moving slowly or fidgety/restless 0 0 0  Suicidal thoughts 0 0 0  PHQ-9 Score 2 2 1   Difficult doing work/chores Somewhat difficult Not difficult at all Not difficult at all    Scribe for Treatment Team: Jonessa Triplett M Jeraldin Fesler, LCSW 07/06/2024 10:49 AM

## 2024-07-06 NOTE — Progress Notes (Signed)
   07/06/24 1500  Psych Admission Type (Psych Patients Only)  Admission Status Involuntary  Psychosocial Assessment  Patient Complaints Anxiety;Sleep disturbance (patient states I feel like I'm going to be locked up forever and under IVC; patient states not so much depression, just mania.)  Eye Contact Fair  Facial Expression Anxious  Affect Preoccupied  Speech Logical/coherent  Interaction Assertive  Motor Activity Slow  Appearance/Hygiene Unremarkable  Behavior Characteristics Cooperative;Appropriate to situation  Mood Anxious;Preoccupied;Pleasant  Aggressive Behavior  Effect No apparent injury  Thought Process  Coherency WDL  Content Preoccupation (patient is still stating that everyone she talked to told her that she didn't need to be in a mental hospital.)  Delusions None reported or observed  Perception WDL  Hallucination None reported or observed  Judgment WDL  Confusion None  Danger to Self  Current suicidal ideation? Denies  Danger to Others  Danger to Others None reported or observed   Patient's goal for today, per her self-inventory is to just get through the day.
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
## 2024-07-06 NOTE — Group Note (Signed)
 Date:  07/06/2024 Time:  9:13 PM  Group Topic/Focus:  Self Care:   The focus of this group is to help patients understand the importance of self-care in order to improve or restore emotional, physical, spiritual, interpersonal, and financial health.    Participation Level:  Active  Participation Quality:  Appropriate  Affect:  Appropriate  Cognitive:  Appropriate  Insight: Appropriate  Engagement in Group:  Engaged  Modes of Intervention:  Discussion  Additional Comments:    Hector Venne L 07/06/2024, 9:13 PM

## 2024-07-06 NOTE — Group Note (Signed)
 Date:  07/06/2024 Time:  11:07 AM  Group Topic/Focus:  Goals Group:   The focus of this group is to help patients establish daily goals to achieve during treatment and discuss how the patient can incorporate goal setting into their daily lives to aide in recovery.    Participation Level:  Active  Participation Quality:  Appropriate  Affect:  Appropriate  Cognitive:  Appropriate  Insight: Appropriate  Engagement in Group:  Engaged  Modes of Intervention:  Activity and Socialization  Additional Comments:    Lori [name] [facility] 07/06/2024, 11:07 AM

## 2024-07-07 DIAGNOSIS — F319 Bipolar disorder, unspecified: Secondary | ICD-10-CM | POA: Diagnosis not present

## 2024-07-07 LAB — HEMOGLOBIN A1C
Hgb A1c MFr Bld: 5.4 % (ref 4.8–5.6)
Mean Plasma Glucose: 108.28 mg/dL

## 2024-07-07 LAB — VITAMIN B12: Vitamin B-12: 222 pg/mL (ref 180–914)

## 2024-07-07 LAB — TSH: TSH: 1.577 u[IU]/mL (ref 0.350–4.500)

## 2024-07-07 LAB — VITAMIN D 25 HYDROXY (VIT D DEFICIENCY, FRACTURES): Vit D, 25-Hydroxy: 19.36 ng/mL — ABNORMAL LOW (ref 30–100)

## 2024-07-07 MED ORDER — NICOTINE POLACRILEX 2 MG MT GUM
2.0000 mg | CHEWING_GUM | OROMUCOSAL | Status: DC | PRN
  Administered 2024-07-07 – 2024-07-11 (×9): 2 mg via ORAL
  Filled 2024-07-07 (×5): qty 1

## 2024-07-07 NOTE — Group Note (Signed)
 LCSW Group Therapy Note  Group Date: 07/07/2024 Start Time: 1400 End Time: 1450   Type of Therapy and Topic:  Group Therapy - Healthy vs Unhealthy Coping Skills  Participation Level:  Did Not Attend   Description of Group The focus of this group was to determine what unhealthy coping techniques typically are used by group members and what healthy coping techniques would be helpful in coping with various problems. Patients were guided in becoming aware of the differences between healthy and unhealthy coping techniques. Patients were asked to identify 2-3 healthy coping skills they would like to learn to use more effectively.  Therapeutic Goals Patients learned that coping is what human beings do all day long to deal with various situations in their lives Patients defined and discussed healthy vs unhealthy coping techniques Patients identified their preferred coping techniques and identified whether these were healthy or unhealthy Patients determined 2-3 healthy coping skills they would like to become more familiar with and use more often. Patients provided support and ideas to each other   Summary of Patient Progress:  The patient did not attend group.   Roselyn GORMAN Lento, LCSWA 07/07/2024  4:08 PM
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
## 2024-07-07 NOTE — Plan of Care (Signed)

## 2024-07-07 NOTE — Group Note (Signed)
 Date:  07/07/2024 Time:  2:14 PM  Group Topic/Focus:  Goals Group:   The focus of this group is to help patients establish daily goals to achieve during treatment and discuss how the patient can incorporate goal setting into their daily lives to aide in recovery.    Participation Level:  Active  Participation Quality:  Appropriate  Affect:  Appropriate  Cognitive:  Appropriate  Insight: Appropriate  Engagement in Group:  Engaged  Modes of Intervention:  Activity, Discussion, and Education  Additional Comments:    Lori [name] 07/07/2024, 2:14 PM

## 2024-07-07 NOTE — Plan of Care (Signed)
   Problem: Education: Goal: Knowledge of [facility] Education information/materials will improve Outcome: Progressing Goal: Emotional status will improve Outcome: Progressing Goal: Mental status will improve Outcome: Progressing Goal: Verbalization of understanding the information provided will improve Outcome: Progressing

## 2024-07-07 NOTE — Progress Notes (Signed)
   07/07/24 1500  Psych Admission Type (Psych Patients Only)  Admission Status Involuntary  Psychosocial Assessment  Patient Complaints Anxiety  Eye Contact Fair  Facial Expression Anxious  Affect Anxious  Speech Logical/coherent  Interaction Assertive  Motor Activity Slow  Appearance/Hygiene Disheveled  Behavior Characteristics Cooperative;Anxious  Mood Anxious;Pleasant  Thought Process  Coherency WDL  Content Blaming others;Paranoia  Delusions Paranoid  Perception WDL  Hallucination None reported or observed  Judgment Impaired  Confusion None  Danger to Self  Current suicidal ideation? Denies  Danger to Others  Danger to Others None reported or observed

## 2024-07-08 DIAGNOSIS — F319 Bipolar disorder, unspecified: Secondary | ICD-10-CM | POA: Diagnosis not present

## 2024-07-08 MED ORDER — VITAMIN D 25 MCG (1000 UNIT) PO TABS
1000.0000 [IU] | ORAL_TABLET | Freq: Every day | ORAL | Status: DC
  Administered 2024-07-08 – 2024-07-11 (×4): 1000 [IU] via ORAL
  Filled 2024-07-08 (×4): qty 1

## 2024-07-08 MED ORDER — LISINOPRIL 5 MG PO TABS: 15.0000 mg | ORAL_TABLET | Freq: Every day | ORAL | Status: DC

## 2024-07-08 MED ORDER — LISINOPRIL 20 MG PO TABS
20.0000 mg | ORAL_TABLET | Freq: Every day | ORAL | Status: DC
  Administered 2024-07-09 – 2024-07-11 (×3): 20 mg via ORAL
  Filled 2024-07-08 (×3): qty 1

## 2024-07-08 NOTE — Group Note (Signed)
 Date:  07/08/2024 Time:  5:17 PM  Group Topic/Focus:  Goals Group:   The focus of this group is to help patients establish daily goals to achieve during treatment and discuss how the patient can incorporate goal setting into their daily lives to aide in recovery.    Participation Level:  Active  Participation Quality:  Appropriate  Affect:  Appropriate  Cognitive:  Alert  Insight: Appropriate  Engagement in Group:  Engaged  Modes of Intervention:  Activity, Discussion, and Education  Additional Comments:    Lori [name] 07/08/2024, 5:17 PM

## 2024-07-08 NOTE — Progress Notes (Signed)
   07/08/24 0845  Psych Admission Type (Psych Patients Only)  Admission Status Involuntary  Psychosocial Assessment  Patient Complaints Anxiety  Eye Contact Brief;Glaring  Facial Expression Anxious  Affect Apprehensive  Speech Logical/coherent  Interaction Assertive  Motor Activity Slow  Appearance/Hygiene Unremarkable  Behavior Characteristics Cooperative  Mood Anxious  Thought Process  Coherency WDL  Content Preoccupation  Delusions None reported or observed  Perception WDL  Hallucination None reported or observed  Judgment WDL  Confusion None  Danger to Self  Current suicidal ideation? Denies  Danger to Others  Danger to Others None reported or observed

## 2024-07-08 NOTE — Plan of Care (Signed)
  Problem: Education: Goal: Emotional status will improve Outcome: Progressing   Problem: Education: Goal: Mental status will improve Outcome: Progressing   Problem: Education: Goal: Verbalization of understanding the information provided will improve Outcome: Progressing   

## 2024-07-08 NOTE — Progress Notes (Signed)
   07/07/24 2000  Psych Admission Type (Psych Patients Only)  Admission Status Involuntary  Psychosocial Assessment  Patient Complaints Anxiety  Eye Contact Brief;Glaring  Facial Expression Anxious  Affect Apprehensive  Speech Logical/coherent  Interaction Assertive  Motor Activity Slow  Appearance/Hygiene Unremarkable  Behavior Characteristics Cooperative;Appropriate to situation  Mood Anxious;Pleasant  Aggressive Behavior  Effect No apparent injury  Thought Process  Coherency WDL  Content Preoccupation  Delusions None reported or observed  Perception WDL  Hallucination None reported or observed  Judgment WDL  Confusion None  Danger to Self  Current suicidal ideation? Denies  Danger to Others  Danger to Others None reported or observed   Patient is alert and oriented x 4, affect is constricted, she forwards little information, speech is non tangential, are  thoughts are linear, she appears fixated on her medication regimen ( oxycodone  ). And irritable when its not tie to dispense the medication to her.  Patient is not interacting with peers. Patient denies SI/HI/AVH, patient ws offered emotional support, 15 minutes safety checks maintained will continue to monitor.

## 2024-07-08 NOTE — Group Note (Signed)
 Date:  07/08/2024 Time:  10:12 PM  Group Topic/Focus:  Goals Group:   The focus of this group is to help patients establish daily goals to achieve during treatment and discuss how the patient can incorporate goal setting into their daily lives to aide in recovery. Making Healthy Choices:   The focus of this group is to help patients identify negative/unhealthy choices they were using prior to admission and identify positive/healthier coping strategies to replace them upon discharge. Self Care:   The focus of this group is to help patients understand the importance of self-care in order to improve or restore emotional, physical, spiritual, interpersonal, and financial health.    Participation Level:  Active  Participation Quality:  Appropriate  Affect:  Appropriate  Cognitive:  Appropriate and Oriented  Insight: Appropriate and Good  Engagement in Group:  Engaged  Modes of Intervention:  Discussion and Support  Additional Comments:  N/A  Lori [name] 07/08/2024, 10:12 PM
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
## 2024-07-08 NOTE — Plan of Care (Signed)
   Problem: Education: Goal: Emotional status will improve Outcome: Progressing Goal: Mental status will improve Outcome: Progressing

## 2024-07-09 MED ORDER — HYDRALAZINE HCL 25 MG PO TABS: 25.0000 mg | ORAL_TABLET | Freq: Three times a day (TID) | ORAL | Status: DC | PRN

## 2024-07-09 NOTE — Progress Notes (Signed)
   07/09/24 1946  Psych Admission Type (Psych Patients Only)  Admission Status Involuntary  Psychosocial Assessment  Patient Complaints Anxiety  Eye Contact Brief  Facial Expression Anxious  Affect Apprehensive  Speech Logical/coherent  Interaction Assertive  Motor Activity Slow  Appearance/Hygiene Unremarkable  Behavior Characteristics Cooperative;Appropriate to situation  Mood Anxious  Aggressive Behavior  Effect No apparent injury  Thought Process  Coherency WDL  Content Preoccupation  Delusions None reported or observed  Perception WDL  Hallucination None reported or observed  Judgment WDL  Confusion None  Danger to Others  Danger to Others None reported or observed

## 2024-07-09 NOTE — Plan of Care (Signed)
  Problem: Education: Goal: Knowledge of Time General Education information/materials will improve Outcome: Progressing Goal: Emotional status will improve Outcome: Not Progressing Goal: Mental status will improve Outcome: Not Progressing Goal: Verbalization of understanding the information provided will improve Outcome: Progressing

## 2024-07-09 NOTE — Plan of Care (Signed)
  Problem: Education: Goal: Mental status will improve Outcome: Progressing   

## 2024-07-09 NOTE — Progress Notes (Signed)
   07/08/24 2000  Psych Admission Type (Psych Patients Only)  Admission Status Involuntary  Psychosocial Assessment  Patient Complaints Anxiety  Eye Contact Brief;Glaring  Facial Expression Anxious  Affect Apprehensive  Speech Logical/coherent  Interaction Assertive  Motor Activity Slow  Appearance/Hygiene Unremarkable  Behavior Characteristics Cooperative;Appropriate to situation  Mood Anxious  Aggressive Behavior  Effect No apparent injury  Thought Process  Coherency WDL  Content Preoccupation  Delusions None reported or observed  Perception WDL  Hallucination None reported or observed  Judgment WDL  Confusion None  Danger to Self  Current suicidal ideation? Denies  Danger to Others  Danger to Others None reported or observed   Patient at baseline, appears anxious and restless, noted pacing on the hallways ad preoccupied her medication regimen and timing. Patient thoughts are linear , affect is congruent wit mood, she denies SI/HI/AVH, was offered emotional support. 15 minutes safety checks maintained. Will continue to monitor.

## 2024-07-09 NOTE — Plan of Care (Signed)
   Problem: Education: Goal: Knowledge of [place] [facility] Education information/materials will improve Outcome: Progressing   Problem: Education: Goal: Emotional status will improve Outcome: Progressing   Problem: Education: Goal: Mental status will improve Outcome: Progressing   Problem: Education: Goal: Verbalization of understanding the information provided will improve Outcome: Progressing

## 2024-07-09 NOTE — Group Note (Signed)
 Recreation Therapy Group Note   Group Topic:Coping Skills  Group Date: 07/09/2024 Start Time: 1230 End Time: 1320 Facilitators: Celestia Jeoffrey BRAVO, LRT, CTRS Location: Craft Room  Group Description: Mind Map.  Patient was provided a blank template of a diagram with 32 blank boxes in a tiered system, branching from the center (similar to a bubble chart). LRT directed patients to label the middle of the diagram Coping Skills. LRT and patients then came up with 8 different coping skills as examples. Pt were directed to record their coping skills in the 2nd tier boxes closest to the center.  Patients would then share their coping skills with the group as LRT wrote them out. LRT gave a handout of 99 different coping skills at the end of group.   Goal Area(s) Addressed: Patients will be able to define "coping skills". Patient will identify new coping skills.  Patient will increase communication.  Affect/Mood: Flat   Participation Level: Minimal    Clinical Observations/Individualized Feedback: Lori [name] was present in group. Pt was noted to be falling asleep sitting up.   Plan: Continue to engage patient in RT group sessions 2-3x/week.   Jeoffrey BRAVO Celestia, LRT, CTRS 07/09/2024 1:58 PM
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
## 2024-07-10 NOTE — Group Note (Signed)
 Date:  07/10/2024 Time:  5:42 PM  Group Topic/Focus:  Wellness Toolbox:   The focus of this group is to discuss various aspects of wellness, balancing those aspects and exploring ways to increase the ability to experience wellness.  Patients will create a wellness toolbox for use upon discharge.    Participation Level:  Active  Participation Quality:  Appropriate  Affect:  Appropriate  Cognitive:  Appropriate  Insight: Appropriate  Engagement in Group:  Engaged  Modes of Intervention:  Activity and Socialization  Additional Comments:    Lori [name] 07/10/2024, 5:42 PM

## 2024-07-10 NOTE — Group Note (Signed)
 Date:  07/10/2024 Time:  1:56 PM  Group Topic/Focus:  Making Healthy Choices:   The focus of this group is to help patients identify negative/unhealthy choices they were using prior to admission and identify positive/healthier coping strategies to replace them upon discharge.    Participation Level:  Active  Participation Quality:  Appropriate  Affect:  Appropriate  Cognitive:  Appropriate  Insight: Appropriate  Engagement in Group:  Engaged  Modes of Intervention:  Activity  Additional Comments:    Lori [name] 07/10/2024, 1:56 PM

## 2024-07-10 NOTE — Group Note (Signed)
 Recreation Therapy Group Note   Group Topic:Emotion Expression  Group Date: 07/10/2024 Start Time: 1000 End Time: 1055 Facilitators: Celestia Jeoffrey BRAVO, LRT, CTRS Location: Craft Room  Group Description: Painting a [employer] operational officer. Patients and LRT discuss what it means to be "at peace", what it feels like physically and mentally. Pts are given a canvas and watercolor paint to use and encouraged to draw their idea of a peaceful place. Pts and LRT discuss how they use this in their daily life post discharge. Pts are encouraged to take their canvas home with them as a reminder to find their peaceful place whenever they are feeling depressed, anxious, etc.    Goal Area(s) Addressed:  Patient will identify what it means to experience a "peaceful" emotion. Patient will identify a new coping skill.  Patient will express their emotions through art. Patients will increase communication by talking with LRT and peers while in group.   Affect/Mood: Appropriate   Participation Level: Active and Engaged   Participation Quality: Independent   Behavior: Appropriate, Calm, and Cooperative   Speech/Thought Process: Coherent   Insight: Good   Judgement: Good   Modes of Intervention: Art   Patient Response to Interventions:  Attentive, Engaged, Interested , and Receptive   Education Outcome:  Acknowledges education   Clinical Observations/Individualized Feedback: Lori [name] was active in their participation of session activities and group discussion. Pt identified my house and watching the deer as her peaceful place. Pt interacted well with LRT and peers duration of session.    Plan: Continue to engage patient in RT group sessions 2-3x/week.   Jeoffrey BRAVO Celestia, LRT, CTRS 07/10/2024 1:01 PM

## 2024-07-10 NOTE — Progress Notes (Signed)
   07/10/24 2200  Psych Admission Type (Psych Patients Only)  Admission Status Involuntary  Psychosocial Assessment  Patient Complaints Anxiety  Eye Contact Fair  Facial Expression Anxious  Affect Anxious  Speech Logical/coherent  Interaction Assertive  Motor Activity Slow  Appearance/Hygiene Unremarkable  Behavior Characteristics Cooperative  Mood Pleasant  Thought Process  Coherency WDL  Content Preoccupation  Delusions None reported or observed  Perception WDL  Hallucination None reported or observed  Judgment Impaired  Confusion None  Danger to Self  Current suicidal ideation? Denies  Danger to Others  Danger to Others None reported or observed
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
## 2024-07-10 NOTE — Plan of Care (Signed)
   Problem: Education: Goal: Emotional status will improve Outcome: Progressing Goal: Mental status will improve Outcome: Progressing

## 2024-07-10 NOTE — Plan of Care (Signed)

## 2024-07-10 NOTE — Group Note (Signed)
 Date:  07/10/2024 Time:  8:59 PM  Group Topic/Focus:  Spirituality:   The focus of this group is to discuss how one's spirituality can aide in recovery.    Participation Level:  Active  Participation Quality:  Appropriate, Attentive, and Sharing  Affect:  Appropriate  Cognitive:  Alert and Appropriate  Insight: Appropriate  Engagement in Group:  Engaged and Improving  Modes of Intervention:  discussion   Additional Comments:     Cythia Bachtel 07/10/2024, 8:59 PM

## 2024-07-10 NOTE — Progress Notes (Signed)
   07/10/24 1000  Psych Admission Type (Psych Patients Only)  Admission Status Involuntary  Psychosocial Assessment  Patient Complaints Anxiety;Depression  Eye Contact Fair  Facial Expression Anxious;Animated  Affect Anxious  Speech Logical/coherent  Interaction Assertive  Motor Activity Slow  Appearance/Hygiene Unremarkable  Behavior Characteristics Cooperative;Anxious  Mood Anxious;Pleasant (Patient writes her goal is Staying calm. She reprots she would like assistance with filing for disability, medicaid and medicare.)  Thought Process  Coherency WDL  Content Delusions  Delusions Other (Comment) (UFO sighting in 2020 and visions)  Perception WDL  Hallucination None reported or observed  Judgment Impaired  Confusion None  Danger to Self  Current suicidal ideation? Denies  Danger to Others  Danger to Others None reported or observed

## 2024-07-11 MED ORDER — TRAZODONE HCL 50 MG PO TABS: 50.0000 mg | ORAL_TABLET | Freq: Every evening | ORAL | 0 refills | Status: AC | PRN

## 2024-07-11 MED ORDER — HYDROXYZINE HCL 25 MG PO TABS: 25.0000 mg | ORAL_TABLET | Freq: Three times a day (TID) | ORAL | 0 refills | Status: AC | PRN

## 2024-07-11 MED ORDER — ARIPIPRAZOLE 5 MG PO TABS: 5.0000 mg | ORAL_TABLET | Freq: Every day | ORAL | 0 refills | Status: AC

## 2024-07-11 MED ORDER — MELATONIN 5 MG PO TABS: 5.0000 mg | ORAL_TABLET | Freq: Every day | ORAL | 0 refills | Status: AC

## 2024-07-11 MED ORDER — LISINOPRIL 20 MG PO TABS: 20.0000 mg | ORAL_TABLET | Freq: Every day | ORAL | 0 refills | Status: AC

## 2024-07-11 MED ORDER — VITAMIN D3 25 MCG PO TABS: 1000.0000 [IU] | ORAL_TABLET | Freq: Every day | ORAL | 0 refills | Status: AC

## 2024-07-11 NOTE — Progress Notes (Signed)
 Patient alert and oriented x 4.  Affect is pleasant and calm. Denies anxiety, SI/HI or AVH.  Patient states they will try to keep themselves safe when they return home.  Reviewed discharge instructions with patient including follow up appointment with provider, medication and prescriptions.  Questions answered and understanding verbalized.  Discharge packet given.  All belongings returned to patient after verification completed by staff.    Patient escorted by staff off unit at this time stable without complaint.Patient alert and oriented x 4.  Affect is pleasant and calm. Denies anxiety, SI/HI or AVH.  Patient states they will try to keep themselves safe when they return home.  Reviewed discharge instructions with patient including follow up appointment with provider, medication and prescriptions.  Questions answered and understanding verbalized.  Discharge packet given.  All belongings returned to patient after verification completed by staff.    Patient escorted by staff off unit at this time stable without complaint.

## 2024-07-11 NOTE — Progress Notes (Signed)
  [facility] Adult Case Management Discharge Plan :  Will you be returning to the same living situation after discharge:  Yes,  patient to return home.  At discharge, do you have transportation home?: Yes,  CSW has arranged taxi services on patient's behalf.  Do you have the ability to pay for your medications: Yes,  BLUE CROSS BLUE SHIELD / [employer]BS[employer] NON-PARTICIPATING O[place]  Release of information consent forms completed and in the chart;  Patient's signature needed at discharge.  Patient to Follow up at:  Follow-up Information     [facility]. Go to.   Why: Virtual assessment for therapy and psychiatry is 07/18/24 at 3:30 PM. Contact information: [address]  [address] [place] [address] [phone]                 Next level of care provider has access to [facility] Link:no  Safety Planning and Suicide Prevention discussed: Yes,  SPE completed with pt, as pt refused to consent to family contact. SPI pamphlet provided to pt and pt was encouraged to share information with support network, ask questions, and talk about any concerns relating to SPE. Pt denies access to guns/firearms and verbalized understanding of information provided. Mobile Crisis information also provided to pt.       Has patient been referred to the Quitline?: Patient refused referral for treatment  Patient has been referred for addiction treatment: No known substance use disorder.  Lori CHRISTELLA Kerns, LCSW 07/11/2024, 10:05 AM

## 2024-07-11 NOTE — [facility] Suicide Risk Assessment (Signed)
 [facility] Discharge Suicide Risk Assessment   Principal Problem: Bipolar 1 disorder (HCC) Discharge Diagnoses: Principal Problem:   Bipolar 1 disorder (HCC)   Total Time spent with patient: 30 minutes  Musculoskeletal: Strength & Muscle Tone: within normal limits Gait & Station: normal Patient leans: N/A  Psychiatric Specialty Exam  Presentation  General Appearance:  Casual  Eye Contact: Good  Speech: Clear and Coherent  Speech Volume: Normal  Handedness: Right   Mood and Affect  Mood: Euthymic  Duration of Depression Symptoms: No data recorded Affect: Appropriate   Thought Process  Thought Processes: Coherent  Descriptions of Associations:Intact  Orientation:Full (Time, Place and Person)  Thought Content:Delusions  History of Schizophrenia/Schizoaffective disorder:No  Duration of Psychotic Symptoms:-- (Unknown)  Hallucinations:Hallucinations: None  Ideas of Reference:None  Suicidal Thoughts:Suicidal Thoughts: No  Homicidal Thoughts:Homicidal Thoughts: No   Sensorium  Memory: Immediate Good; Recent Fair; Remote Fair  Judgment: Fair  Insight: Fair   Art therapist  Concentration: Fair  Attention Span: Fair  Recall: [employer] of Knowledge: Fair  Language: Fair   Psychomotor Activity  Psychomotor Activity: Psychomotor Activity: Normal   Assets  Assets: Communication Skills; Desire for Improvement; Housing   Sleep  Sleep: Sleep: Fair  Estimated Sleeping Duration (Last 24 Hours): 5.00-6.50 hours  Physical Exam: Physical Exam ROS Blood pressure (!) 161/76, pulse 93, temperature 98.4 F (36.9 C), resp. rate 17, height 5' 7 (1.702 m), weight 64.9 kg, SpO2 100%. Body mass index is 22.4 kg/m.  Mental Status Per Nursing Assessment::   On Admission:  NA  Demographic Factors:  Divorced or widowed and Caucasian  Loss Factors: Loss of significant relationship  Historical Factors: NA  Risk Reduction  Factors:   Living with another person, especially a relative  Continued Clinical Symptoms:  Bipolar Disorder:   Depressive phase  Cognitive Features That Contribute To Risk:  None    Suicide Risk:  Minimal: No identifiable suicidal ideation.  Patients presenting with no risk factors but with morbid ruminations; may be classified as minimal risk based on the severity of the depressive symptoms   Follow-up Information     [facility]. Go to.   Why: Virtual assessment for therapy and psychiatry is 07/18/24 at 3:30 PM. Contact information: [address]  [address] [place] [address] [phone]                 Plan Of Care/Follow-up recommendations:  Activity:  as tolerated  Camelia LITTIE Lukes, PA-C 07/11/2024, 1:13 PM
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
## 2024-07-11 NOTE — Progress Notes (Signed)
   07/11/24 0835  Psych Admission Type (Psych Patients Only)  Admission Status Involuntary  Psychosocial Assessment  Patient Complaints Anxiety;Depression  Eye Contact Brief  Facial Expression Animated  Affect Anxious  Speech Logical/coherent  Interaction Assertive  Motor Activity Slow  Appearance/Hygiene Unremarkable  Behavior Characteristics Cooperative  Mood Anxious  Thought Process  Coherency WDL  Content WDL  Delusions None reported or observed  Perception WDL  Hallucination None reported or observed  Judgment Impaired  Confusion None  Danger to Self  Current suicidal ideation? Denies  Danger to Others  Danger to Others None reported or observed
